# Patient Record
Sex: Male | Born: 1955
Health system: Southern US, Community
[De-identification: ages and names within clinical notes are randomized; demographics above are authoritative.]

---

## 2006-09-20 ENCOUNTER — Ambulatory Visit (HOSPITAL_COMMUNITY): Admission: AD | Admit: 2006-09-20 | Discharge: 2006-09-22 | Payer: Self-pay | Admitting: Neurosurgery

## 2013-04-03 ENCOUNTER — Other Ambulatory Visit: Payer: Self-pay | Admitting: Sports Medicine

## 2013-04-03 ENCOUNTER — Ambulatory Visit (INDEPENDENT_AMBULATORY_CARE_PROVIDER_SITE_OTHER): Payer: BC Managed Care – PPO

## 2013-04-03 DIAGNOSIS — R52 Pain, unspecified: Secondary | ICD-10-CM

## 2013-04-03 DIAGNOSIS — R937 Abnormal findings on diagnostic imaging of other parts of musculoskeletal system: Secondary | ICD-10-CM

## 2013-06-26 ENCOUNTER — Ambulatory Visit (INDEPENDENT_AMBULATORY_CARE_PROVIDER_SITE_OTHER): Payer: BC Managed Care – PPO | Admitting: Physical Therapy

## 2013-06-26 DIAGNOSIS — M25619 Stiffness of unspecified shoulder, not elsewhere classified: Secondary | ICD-10-CM

## 2013-06-26 DIAGNOSIS — M67919 Unspecified disorder of synovium and tendon, unspecified shoulder: Secondary | ICD-10-CM

## 2013-06-26 DIAGNOSIS — M719 Bursopathy, unspecified: Secondary | ICD-10-CM

## 2013-06-26 DIAGNOSIS — R609 Edema, unspecified: Secondary | ICD-10-CM

## 2013-06-26 DIAGNOSIS — M25519 Pain in unspecified shoulder: Secondary | ICD-10-CM

## 2013-06-26 DIAGNOSIS — M6281 Muscle weakness (generalized): Secondary | ICD-10-CM

## 2013-07-01 ENCOUNTER — Encounter (INDEPENDENT_AMBULATORY_CARE_PROVIDER_SITE_OTHER): Payer: BC Managed Care – PPO | Admitting: Physical Therapy

## 2013-07-01 DIAGNOSIS — M25619 Stiffness of unspecified shoulder, not elsewhere classified: Secondary | ICD-10-CM

## 2013-07-01 DIAGNOSIS — M25519 Pain in unspecified shoulder: Secondary | ICD-10-CM

## 2013-07-01 DIAGNOSIS — M67919 Unspecified disorder of synovium and tendon, unspecified shoulder: Secondary | ICD-10-CM

## 2013-07-01 DIAGNOSIS — R609 Edema, unspecified: Secondary | ICD-10-CM

## 2013-07-01 DIAGNOSIS — M719 Bursopathy, unspecified: Secondary | ICD-10-CM

## 2013-07-01 DIAGNOSIS — M6281 Muscle weakness (generalized): Secondary | ICD-10-CM

## 2013-07-03 ENCOUNTER — Encounter: Payer: BC Managed Care – PPO | Admitting: Physical Therapy

## 2013-07-03 ENCOUNTER — Encounter (INDEPENDENT_AMBULATORY_CARE_PROVIDER_SITE_OTHER): Payer: BC Managed Care – PPO

## 2013-07-03 DIAGNOSIS — M67919 Unspecified disorder of synovium and tendon, unspecified shoulder: Secondary | ICD-10-CM

## 2013-07-03 DIAGNOSIS — M25619 Stiffness of unspecified shoulder, not elsewhere classified: Secondary | ICD-10-CM

## 2013-07-03 DIAGNOSIS — R609 Edema, unspecified: Secondary | ICD-10-CM

## 2013-07-03 DIAGNOSIS — M6281 Muscle weakness (generalized): Secondary | ICD-10-CM

## 2013-07-03 DIAGNOSIS — M25519 Pain in unspecified shoulder: Secondary | ICD-10-CM

## 2013-07-03 DIAGNOSIS — M719 Bursopathy, unspecified: Secondary | ICD-10-CM

## 2013-07-08 ENCOUNTER — Encounter (INDEPENDENT_AMBULATORY_CARE_PROVIDER_SITE_OTHER): Payer: BC Managed Care – PPO | Admitting: Physical Therapy

## 2013-07-08 DIAGNOSIS — M25519 Pain in unspecified shoulder: Secondary | ICD-10-CM

## 2013-07-08 DIAGNOSIS — M25619 Stiffness of unspecified shoulder, not elsewhere classified: Secondary | ICD-10-CM

## 2013-07-08 DIAGNOSIS — R609 Edema, unspecified: Secondary | ICD-10-CM

## 2013-07-08 DIAGNOSIS — M719 Bursopathy, unspecified: Secondary | ICD-10-CM

## 2013-07-08 DIAGNOSIS — M67919 Unspecified disorder of synovium and tendon, unspecified shoulder: Secondary | ICD-10-CM

## 2013-07-08 DIAGNOSIS — M6281 Muscle weakness (generalized): Secondary | ICD-10-CM

## 2013-07-10 ENCOUNTER — Encounter: Payer: BC Managed Care – PPO | Admitting: Physical Therapy

## 2013-07-15 ENCOUNTER — Encounter: Payer: BC Managed Care – PPO | Admitting: Physical Therapy

## 2013-07-16 ENCOUNTER — Encounter (INDEPENDENT_AMBULATORY_CARE_PROVIDER_SITE_OTHER): Payer: BC Managed Care – PPO

## 2013-07-16 DIAGNOSIS — R609 Edema, unspecified: Secondary | ICD-10-CM

## 2013-07-16 DIAGNOSIS — M25519 Pain in unspecified shoulder: Secondary | ICD-10-CM

## 2013-07-16 DIAGNOSIS — M67919 Unspecified disorder of synovium and tendon, unspecified shoulder: Secondary | ICD-10-CM

## 2013-07-16 DIAGNOSIS — M25619 Stiffness of unspecified shoulder, not elsewhere classified: Secondary | ICD-10-CM

## 2013-07-16 DIAGNOSIS — M6281 Muscle weakness (generalized): Secondary | ICD-10-CM

## 2013-07-16 DIAGNOSIS — M719 Bursopathy, unspecified: Secondary | ICD-10-CM

## 2013-07-17 ENCOUNTER — Encounter (INDEPENDENT_AMBULATORY_CARE_PROVIDER_SITE_OTHER): Payer: BC Managed Care – PPO | Admitting: Physical Therapy

## 2013-07-17 DIAGNOSIS — R609 Edema, unspecified: Secondary | ICD-10-CM

## 2013-07-17 DIAGNOSIS — M25619 Stiffness of unspecified shoulder, not elsewhere classified: Secondary | ICD-10-CM

## 2013-07-17 DIAGNOSIS — M67919 Unspecified disorder of synovium and tendon, unspecified shoulder: Secondary | ICD-10-CM

## 2013-07-17 DIAGNOSIS — M25519 Pain in unspecified shoulder: Secondary | ICD-10-CM

## 2013-07-17 DIAGNOSIS — M719 Bursopathy, unspecified: Secondary | ICD-10-CM

## 2013-07-17 DIAGNOSIS — M6281 Muscle weakness (generalized): Secondary | ICD-10-CM

## 2013-07-22 ENCOUNTER — Encounter: Payer: BC Managed Care – PPO | Admitting: Physical Therapy

## 2013-07-23 ENCOUNTER — Encounter (INDEPENDENT_AMBULATORY_CARE_PROVIDER_SITE_OTHER): Payer: BC Managed Care – PPO

## 2013-07-23 DIAGNOSIS — M6281 Muscle weakness (generalized): Secondary | ICD-10-CM

## 2013-07-23 DIAGNOSIS — M25619 Stiffness of unspecified shoulder, not elsewhere classified: Secondary | ICD-10-CM

## 2013-07-23 DIAGNOSIS — M67919 Unspecified disorder of synovium and tendon, unspecified shoulder: Secondary | ICD-10-CM

## 2013-07-23 DIAGNOSIS — M719 Bursopathy, unspecified: Secondary | ICD-10-CM

## 2013-07-23 DIAGNOSIS — M25519 Pain in unspecified shoulder: Secondary | ICD-10-CM

## 2013-07-23 DIAGNOSIS — R609 Edema, unspecified: Secondary | ICD-10-CM

## 2013-07-24 ENCOUNTER — Encounter: Payer: BC Managed Care – PPO | Admitting: Physical Therapy

## 2013-07-29 ENCOUNTER — Encounter: Payer: BC Managed Care – PPO | Admitting: Physical Therapy

## 2013-08-01 ENCOUNTER — Encounter (INDEPENDENT_AMBULATORY_CARE_PROVIDER_SITE_OTHER): Payer: BC Managed Care – PPO | Admitting: Physical Therapy

## 2013-08-01 DIAGNOSIS — M719 Bursopathy, unspecified: Secondary | ICD-10-CM

## 2013-08-01 DIAGNOSIS — M67919 Unspecified disorder of synovium and tendon, unspecified shoulder: Secondary | ICD-10-CM

## 2013-08-01 DIAGNOSIS — R609 Edema, unspecified: Secondary | ICD-10-CM

## 2013-08-01 DIAGNOSIS — M6281 Muscle weakness (generalized): Secondary | ICD-10-CM

## 2013-08-01 DIAGNOSIS — M25519 Pain in unspecified shoulder: Secondary | ICD-10-CM

## 2013-08-05 ENCOUNTER — Encounter (INDEPENDENT_AMBULATORY_CARE_PROVIDER_SITE_OTHER): Payer: BC Managed Care – PPO | Admitting: Physical Therapy

## 2013-08-05 DIAGNOSIS — M25519 Pain in unspecified shoulder: Secondary | ICD-10-CM

## 2013-08-05 DIAGNOSIS — M6281 Muscle weakness (generalized): Secondary | ICD-10-CM

## 2013-08-05 DIAGNOSIS — M25619 Stiffness of unspecified shoulder, not elsewhere classified: Secondary | ICD-10-CM

## 2013-08-05 DIAGNOSIS — R609 Edema, unspecified: Secondary | ICD-10-CM

## 2013-08-05 DIAGNOSIS — M7500 Adhesive capsulitis of unspecified shoulder: Secondary | ICD-10-CM

## 2013-08-07 ENCOUNTER — Encounter (INDEPENDENT_AMBULATORY_CARE_PROVIDER_SITE_OTHER): Payer: BC Managed Care – PPO

## 2013-08-07 DIAGNOSIS — M25619 Stiffness of unspecified shoulder, not elsewhere classified: Secondary | ICD-10-CM

## 2013-08-07 DIAGNOSIS — M25519 Pain in unspecified shoulder: Secondary | ICD-10-CM

## 2013-08-07 DIAGNOSIS — M6281 Muscle weakness (generalized): Secondary | ICD-10-CM

## 2013-08-07 DIAGNOSIS — R609 Edema, unspecified: Secondary | ICD-10-CM

## 2013-08-07 DIAGNOSIS — M719 Bursopathy, unspecified: Secondary | ICD-10-CM

## 2013-08-07 DIAGNOSIS — M67919 Unspecified disorder of synovium and tendon, unspecified shoulder: Secondary | ICD-10-CM

## 2013-08-12 ENCOUNTER — Encounter (INDEPENDENT_AMBULATORY_CARE_PROVIDER_SITE_OTHER): Payer: BC Managed Care – PPO | Admitting: Physical Therapy

## 2013-08-12 DIAGNOSIS — M25519 Pain in unspecified shoulder: Secondary | ICD-10-CM

## 2013-08-12 DIAGNOSIS — M6281 Muscle weakness (generalized): Secondary | ICD-10-CM

## 2013-08-12 DIAGNOSIS — R609 Edema, unspecified: Secondary | ICD-10-CM

## 2013-08-12 DIAGNOSIS — M7500 Adhesive capsulitis of unspecified shoulder: Secondary | ICD-10-CM

## 2013-08-12 DIAGNOSIS — M25619 Stiffness of unspecified shoulder, not elsewhere classified: Secondary | ICD-10-CM

## 2013-08-14 ENCOUNTER — Encounter (INDEPENDENT_AMBULATORY_CARE_PROVIDER_SITE_OTHER): Payer: BC Managed Care – PPO | Admitting: Physical Therapy

## 2013-08-14 DIAGNOSIS — M25619 Stiffness of unspecified shoulder, not elsewhere classified: Secondary | ICD-10-CM

## 2013-08-14 DIAGNOSIS — M25519 Pain in unspecified shoulder: Secondary | ICD-10-CM

## 2013-08-14 DIAGNOSIS — M6281 Muscle weakness (generalized): Secondary | ICD-10-CM

## 2013-08-14 DIAGNOSIS — M67919 Unspecified disorder of synovium and tendon, unspecified shoulder: Secondary | ICD-10-CM

## 2013-08-14 DIAGNOSIS — M719 Bursopathy, unspecified: Secondary | ICD-10-CM

## 2013-08-14 DIAGNOSIS — R609 Edema, unspecified: Secondary | ICD-10-CM

## 2013-08-15 ENCOUNTER — Encounter: Payer: BC Managed Care – PPO | Admitting: Physical Therapy

## 2013-08-19 ENCOUNTER — Encounter (INDEPENDENT_AMBULATORY_CARE_PROVIDER_SITE_OTHER): Payer: BC Managed Care – PPO | Admitting: Physical Therapy

## 2013-08-19 DIAGNOSIS — M719 Bursopathy, unspecified: Secondary | ICD-10-CM

## 2013-08-19 DIAGNOSIS — IMO0002 Reserved for concepts with insufficient information to code with codable children: Secondary | ICD-10-CM

## 2013-08-19 DIAGNOSIS — M25519 Pain in unspecified shoulder: Secondary | ICD-10-CM

## 2013-08-19 DIAGNOSIS — M67919 Unspecified disorder of synovium and tendon, unspecified shoulder: Secondary | ICD-10-CM

## 2013-08-19 DIAGNOSIS — R609 Edema, unspecified: Secondary | ICD-10-CM

## 2013-08-21 ENCOUNTER — Encounter (INDEPENDENT_AMBULATORY_CARE_PROVIDER_SITE_OTHER): Payer: BC Managed Care – PPO

## 2013-08-21 DIAGNOSIS — M25519 Pain in unspecified shoulder: Secondary | ICD-10-CM

## 2013-08-21 DIAGNOSIS — R609 Edema, unspecified: Secondary | ICD-10-CM

## 2013-08-21 DIAGNOSIS — M719 Bursopathy, unspecified: Secondary | ICD-10-CM

## 2013-08-21 DIAGNOSIS — M6281 Muscle weakness (generalized): Secondary | ICD-10-CM

## 2013-08-21 DIAGNOSIS — M25619 Stiffness of unspecified shoulder, not elsewhere classified: Secondary | ICD-10-CM

## 2013-08-21 DIAGNOSIS — M67919 Unspecified disorder of synovium and tendon, unspecified shoulder: Secondary | ICD-10-CM

## 2013-08-26 ENCOUNTER — Encounter (INDEPENDENT_AMBULATORY_CARE_PROVIDER_SITE_OTHER): Payer: BC Managed Care – PPO | Admitting: Physical Therapy

## 2013-08-26 DIAGNOSIS — R609 Edema, unspecified: Secondary | ICD-10-CM

## 2013-08-26 DIAGNOSIS — M25519 Pain in unspecified shoulder: Secondary | ICD-10-CM

## 2013-08-26 DIAGNOSIS — M6281 Muscle weakness (generalized): Secondary | ICD-10-CM

## 2013-08-26 DIAGNOSIS — M67919 Unspecified disorder of synovium and tendon, unspecified shoulder: Secondary | ICD-10-CM

## 2013-08-26 DIAGNOSIS — M719 Bursopathy, unspecified: Secondary | ICD-10-CM

## 2013-09-02 ENCOUNTER — Encounter (INDEPENDENT_AMBULATORY_CARE_PROVIDER_SITE_OTHER): Payer: BC Managed Care – PPO | Admitting: Physical Therapy

## 2013-09-02 DIAGNOSIS — M6281 Muscle weakness (generalized): Secondary | ICD-10-CM

## 2013-09-02 DIAGNOSIS — M25619 Stiffness of unspecified shoulder, not elsewhere classified: Secondary | ICD-10-CM

## 2013-09-02 DIAGNOSIS — M25519 Pain in unspecified shoulder: Secondary | ICD-10-CM

## 2013-09-02 DIAGNOSIS — M719 Bursopathy, unspecified: Secondary | ICD-10-CM

## 2013-09-02 DIAGNOSIS — R609 Edema, unspecified: Secondary | ICD-10-CM

## 2013-09-02 DIAGNOSIS — M67919 Unspecified disorder of synovium and tendon, unspecified shoulder: Secondary | ICD-10-CM

## 2013-09-09 ENCOUNTER — Encounter (INDEPENDENT_AMBULATORY_CARE_PROVIDER_SITE_OTHER): Payer: BC Managed Care – PPO | Admitting: Physical Therapy

## 2013-09-09 DIAGNOSIS — M25619 Stiffness of unspecified shoulder, not elsewhere classified: Secondary | ICD-10-CM

## 2013-09-09 DIAGNOSIS — M67919 Unspecified disorder of synovium and tendon, unspecified shoulder: Secondary | ICD-10-CM

## 2013-09-09 DIAGNOSIS — M25519 Pain in unspecified shoulder: Secondary | ICD-10-CM

## 2013-09-09 DIAGNOSIS — M6281 Muscle weakness (generalized): Secondary | ICD-10-CM

## 2013-09-09 DIAGNOSIS — M719 Bursopathy, unspecified: Secondary | ICD-10-CM

## 2013-09-09 DIAGNOSIS — R609 Edema, unspecified: Secondary | ICD-10-CM

## 2013-09-11 ENCOUNTER — Encounter: Payer: BC Managed Care – PPO | Admitting: Physical Therapy

## 2013-09-16 ENCOUNTER — Encounter (INDEPENDENT_AMBULATORY_CARE_PROVIDER_SITE_OTHER): Payer: BC Managed Care – PPO | Admitting: Physical Therapy

## 2013-09-16 DIAGNOSIS — M719 Bursopathy, unspecified: Secondary | ICD-10-CM

## 2013-09-16 DIAGNOSIS — M25619 Stiffness of unspecified shoulder, not elsewhere classified: Secondary | ICD-10-CM

## 2013-09-16 DIAGNOSIS — M6281 Muscle weakness (generalized): Secondary | ICD-10-CM

## 2013-09-16 DIAGNOSIS — M25519 Pain in unspecified shoulder: Secondary | ICD-10-CM

## 2013-09-16 DIAGNOSIS — M67919 Unspecified disorder of synovium and tendon, unspecified shoulder: Secondary | ICD-10-CM

## 2013-09-23 ENCOUNTER — Encounter: Payer: BC Managed Care – PPO | Admitting: Physical Therapy

## 2015-09-01 DIAGNOSIS — E291 Testicular hypofunction: Secondary | ICD-10-CM | POA: Diagnosis not present

## 2015-10-06 DIAGNOSIS — E291 Testicular hypofunction: Secondary | ICD-10-CM | POA: Diagnosis not present

## 2015-11-03 DIAGNOSIS — E291 Testicular hypofunction: Secondary | ICD-10-CM | POA: Diagnosis not present

## 2015-12-30 DIAGNOSIS — Z0000 Encounter for general adult medical examination without abnormal findings: Secondary | ICD-10-CM | POA: Diagnosis not present

## 2016-02-29 DIAGNOSIS — E291 Testicular hypofunction: Secondary | ICD-10-CM | POA: Diagnosis not present

## 2016-04-07 DIAGNOSIS — E291 Testicular hypofunction: Secondary | ICD-10-CM | POA: Diagnosis not present

## 2016-04-07 DIAGNOSIS — Z23 Encounter for immunization: Secondary | ICD-10-CM | POA: Diagnosis not present

## 2016-05-16 DIAGNOSIS — E291 Testicular hypofunction: Secondary | ICD-10-CM | POA: Diagnosis not present

## 2016-06-26 DIAGNOSIS — J069 Acute upper respiratory infection, unspecified: Secondary | ICD-10-CM | POA: Diagnosis not present

## 2016-06-26 DIAGNOSIS — J9801 Acute bronchospasm: Secondary | ICD-10-CM | POA: Diagnosis not present

## 2016-07-06 DIAGNOSIS — I10 Essential (primary) hypertension: Secondary | ICD-10-CM | POA: Diagnosis not present

## 2016-07-06 DIAGNOSIS — E7800 Pure hypercholesterolemia, unspecified: Secondary | ICD-10-CM | POA: Diagnosis not present

## 2016-07-06 DIAGNOSIS — K219 Gastro-esophageal reflux disease without esophagitis: Secondary | ICD-10-CM | POA: Diagnosis not present

## 2016-07-06 DIAGNOSIS — E291 Testicular hypofunction: Secondary | ICD-10-CM | POA: Diagnosis not present

## 2016-08-11 DIAGNOSIS — I10 Essential (primary) hypertension: Secondary | ICD-10-CM | POA: Diagnosis not present

## 2016-08-11 DIAGNOSIS — K219 Gastro-esophageal reflux disease without esophagitis: Secondary | ICD-10-CM | POA: Diagnosis not present

## 2016-08-11 DIAGNOSIS — E7800 Pure hypercholesterolemia, unspecified: Secondary | ICD-10-CM | POA: Diagnosis not present

## 2016-08-11 DIAGNOSIS — J9801 Acute bronchospasm: Secondary | ICD-10-CM | POA: Diagnosis not present

## 2016-08-11 DIAGNOSIS — J069 Acute upper respiratory infection, unspecified: Secondary | ICD-10-CM | POA: Diagnosis not present

## 2016-08-11 DIAGNOSIS — E349 Endocrine disorder, unspecified: Secondary | ICD-10-CM | POA: Diagnosis not present

## 2016-09-11 DIAGNOSIS — E291 Testicular hypofunction: Secondary | ICD-10-CM | POA: Diagnosis not present

## 2016-09-22 DIAGNOSIS — M109 Gout, unspecified: Secondary | ICD-10-CM | POA: Diagnosis not present

## 2016-10-13 DIAGNOSIS — E291 Testicular hypofunction: Secondary | ICD-10-CM | POA: Diagnosis not present

## 2016-12-05 DIAGNOSIS — E291 Testicular hypofunction: Secondary | ICD-10-CM | POA: Diagnosis not present

## 2017-01-11 DIAGNOSIS — E291 Testicular hypofunction: Secondary | ICD-10-CM | POA: Diagnosis not present

## 2017-01-16 DIAGNOSIS — Z0000 Encounter for general adult medical examination without abnormal findings: Secondary | ICD-10-CM | POA: Diagnosis not present

## 2017-01-16 DIAGNOSIS — K219 Gastro-esophageal reflux disease without esophagitis: Secondary | ICD-10-CM | POA: Diagnosis not present

## 2017-01-16 DIAGNOSIS — I10 Essential (primary) hypertension: Secondary | ICD-10-CM | POA: Diagnosis not present

## 2017-01-16 DIAGNOSIS — E7800 Pure hypercholesterolemia, unspecified: Secondary | ICD-10-CM | POA: Diagnosis not present

## 2017-01-16 DIAGNOSIS — J309 Allergic rhinitis, unspecified: Secondary | ICD-10-CM | POA: Diagnosis not present

## 2017-03-07 DIAGNOSIS — Z23 Encounter for immunization: Secondary | ICD-10-CM | POA: Diagnosis not present

## 2017-03-07 DIAGNOSIS — E291 Testicular hypofunction: Secondary | ICD-10-CM | POA: Diagnosis not present

## 2017-05-30 DIAGNOSIS — E291 Testicular hypofunction: Secondary | ICD-10-CM | POA: Diagnosis not present

## 2017-07-11 DIAGNOSIS — E7800 Pure hypercholesterolemia, unspecified: Secondary | ICD-10-CM | POA: Diagnosis not present

## 2017-07-11 DIAGNOSIS — E291 Testicular hypofunction: Secondary | ICD-10-CM | POA: Diagnosis not present

## 2017-07-11 DIAGNOSIS — I10 Essential (primary) hypertension: Secondary | ICD-10-CM | POA: Diagnosis not present

## 2017-07-11 DIAGNOSIS — K219 Gastro-esophageal reflux disease without esophagitis: Secondary | ICD-10-CM | POA: Diagnosis not present

## 2017-07-19 DIAGNOSIS — E291 Testicular hypofunction: Secondary | ICD-10-CM | POA: Diagnosis not present

## 2017-08-14 ENCOUNTER — Other Ambulatory Visit: Payer: Self-pay | Admitting: Unknown Physician Specialty

## 2017-08-14 DIAGNOSIS — J029 Acute pharyngitis, unspecified: Secondary | ICD-10-CM | POA: Diagnosis not present

## 2017-08-14 DIAGNOSIS — R05 Cough: Secondary | ICD-10-CM | POA: Diagnosis not present

## 2017-08-14 DIAGNOSIS — R220 Localized swelling, mass and lump, head: Secondary | ICD-10-CM | POA: Diagnosis not present

## 2017-08-14 DIAGNOSIS — K1379 Other lesions of oral mucosa: Secondary | ICD-10-CM

## 2017-08-14 DIAGNOSIS — R6884 Jaw pain: Secondary | ICD-10-CM | POA: Diagnosis not present

## 2017-08-15 ENCOUNTER — Ambulatory Visit (INDEPENDENT_AMBULATORY_CARE_PROVIDER_SITE_OTHER): Payer: BLUE CROSS/BLUE SHIELD

## 2017-08-15 DIAGNOSIS — R590 Localized enlarged lymph nodes: Secondary | ICD-10-CM

## 2017-08-15 DIAGNOSIS — K118 Other diseases of salivary glands: Secondary | ICD-10-CM | POA: Diagnosis not present

## 2017-08-15 DIAGNOSIS — K1379 Other lesions of oral mucosa: Secondary | ICD-10-CM

## 2017-10-18 DIAGNOSIS — E291 Testicular hypofunction: Secondary | ICD-10-CM | POA: Diagnosis not present

## 2017-10-23 DIAGNOSIS — J309 Allergic rhinitis, unspecified: Secondary | ICD-10-CM | POA: Diagnosis not present

## 2017-10-23 DIAGNOSIS — J9801 Acute bronchospasm: Secondary | ICD-10-CM | POA: Diagnosis not present

## 2017-12-27 DIAGNOSIS — E291 Testicular hypofunction: Secondary | ICD-10-CM | POA: Diagnosis not present

## 2018-01-31 DIAGNOSIS — Z23 Encounter for immunization: Secondary | ICD-10-CM | POA: Diagnosis not present

## 2018-01-31 DIAGNOSIS — I10 Essential (primary) hypertension: Secondary | ICD-10-CM | POA: Diagnosis not present

## 2018-01-31 DIAGNOSIS — M109 Gout, unspecified: Secondary | ICD-10-CM | POA: Diagnosis not present

## 2018-01-31 DIAGNOSIS — E7800 Pure hypercholesterolemia, unspecified: Secondary | ICD-10-CM | POA: Diagnosis not present

## 2018-01-31 DIAGNOSIS — E291 Testicular hypofunction: Secondary | ICD-10-CM | POA: Diagnosis not present

## 2018-01-31 DIAGNOSIS — Z0000 Encounter for general adult medical examination without abnormal findings: Secondary | ICD-10-CM | POA: Diagnosis not present

## 2018-02-01 DIAGNOSIS — Z79899 Other long term (current) drug therapy: Secondary | ICD-10-CM | POA: Diagnosis not present

## 2018-02-01 DIAGNOSIS — R7989 Other specified abnormal findings of blood chemistry: Secondary | ICD-10-CM | POA: Diagnosis not present

## 2018-03-04 DIAGNOSIS — E291 Testicular hypofunction: Secondary | ICD-10-CM | POA: Diagnosis not present

## 2018-04-04 DIAGNOSIS — E291 Testicular hypofunction: Secondary | ICD-10-CM | POA: Diagnosis not present

## 2018-05-09 DIAGNOSIS — E291 Testicular hypofunction: Secondary | ICD-10-CM | POA: Diagnosis not present

## 2018-05-09 DIAGNOSIS — R51 Headache: Secondary | ICD-10-CM | POA: Diagnosis not present

## 2018-05-09 DIAGNOSIS — J329 Chronic sinusitis, unspecified: Secondary | ICD-10-CM | POA: Diagnosis not present

## 2018-06-04 DIAGNOSIS — E291 Testicular hypofunction: Secondary | ICD-10-CM | POA: Diagnosis not present

## 2018-06-07 IMAGING — US US SOFT TISSUE HEAD/NECK
1 series · 9 of 9 positions shown · non-contrast
Comparison: None.

ADDENDUM:
Please add the following to the clinical information
CLINICAL DATA: The palpable abnormality is in the soft tissues just
anterior to the left ear lobe and at the level of the angle of the
mandible. The patient did point out the palpable abnormality. The
examination was done over the palpable abnormality.
CLINICAL DATA: Left submandibular nodule with tenderness.

EXAM:
ULTRASOUND OF HEAD/NECK SOFT TISSUES
TECHNIQUE: Ultrasound examination of the head and neck soft tissues was
performed in the area of clinical concern.

[Series 1: us soft tissue head/neck · 0.06mm/px · 9 of 9 slices shown]
[im 1/9]
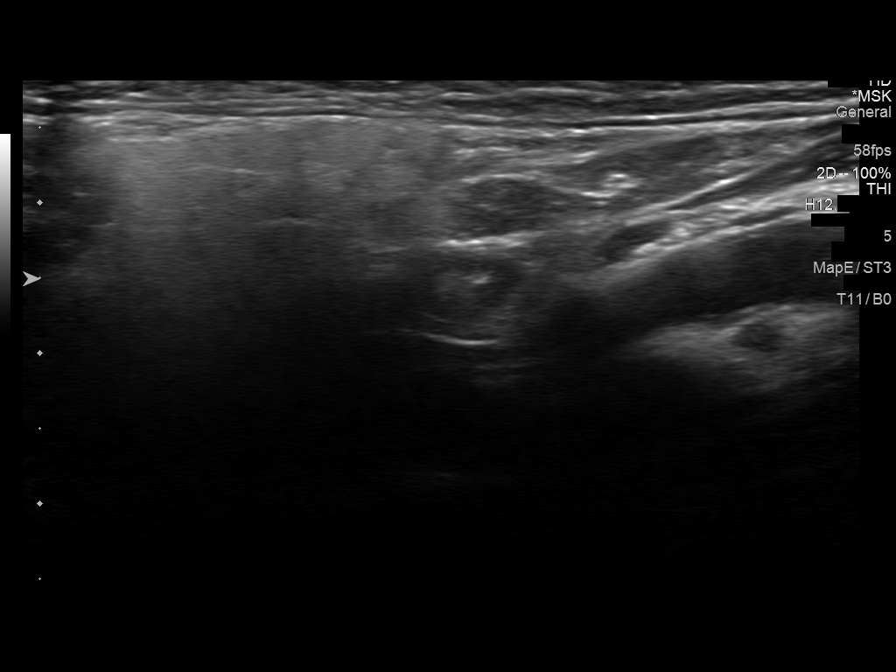
[im 2/9]
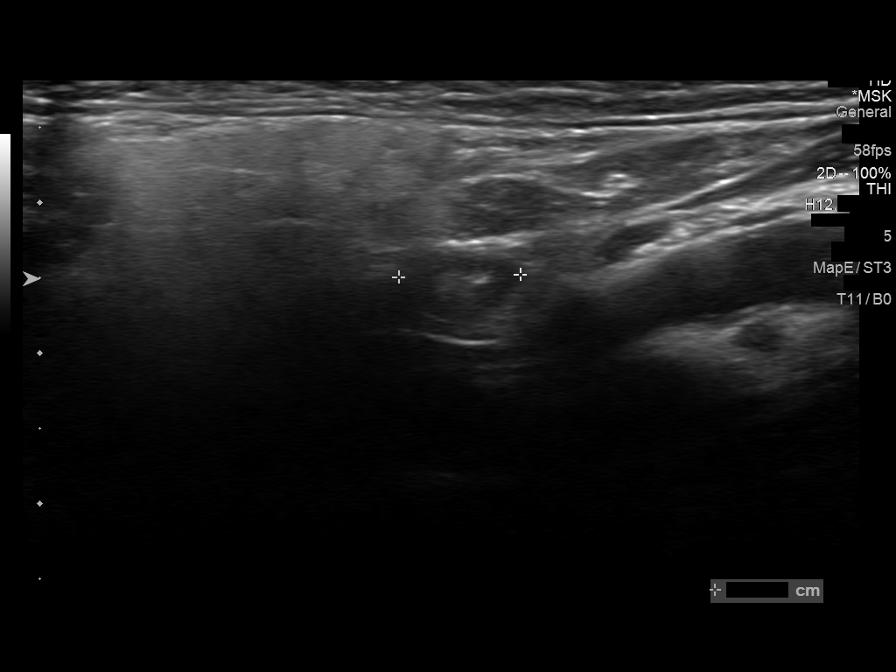
[im 3/9]
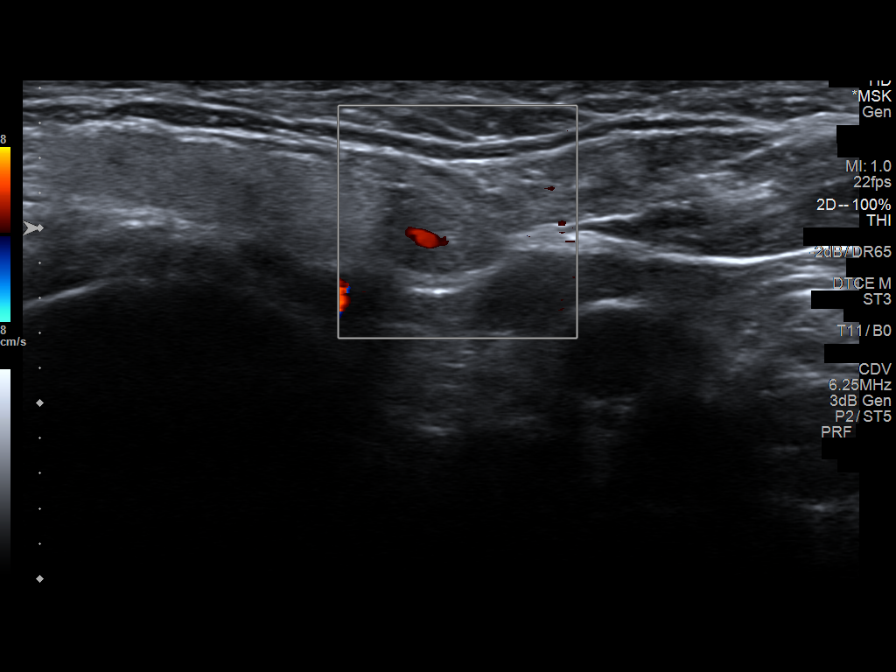
[im 4/9]
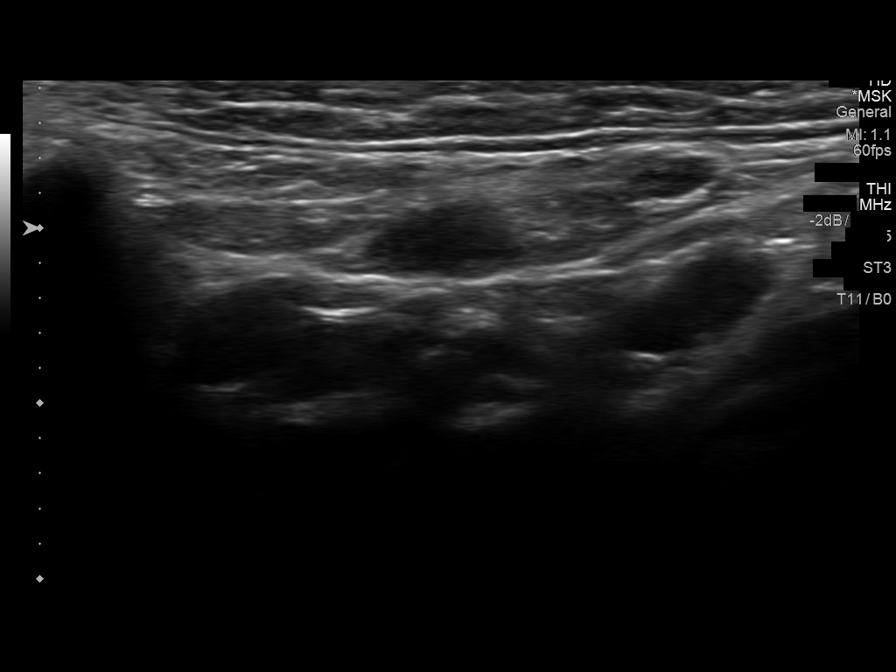
[im 5/9]
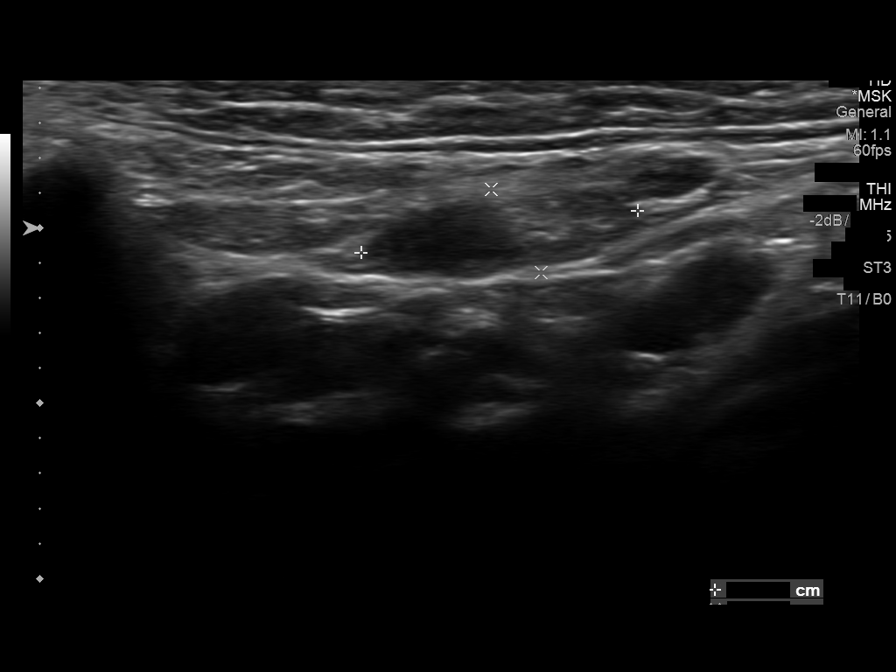
[im 6/9]
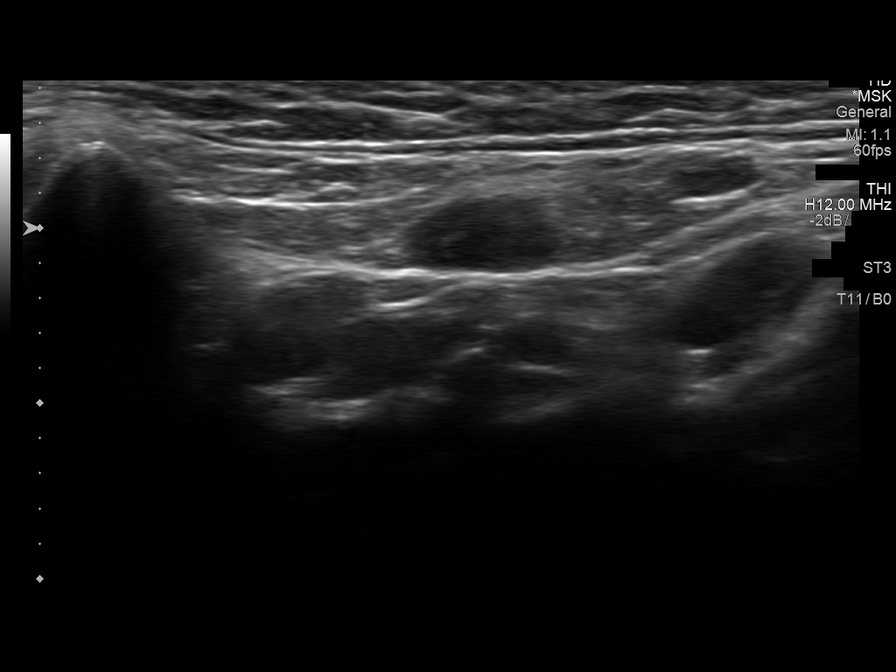
[im 7/9]
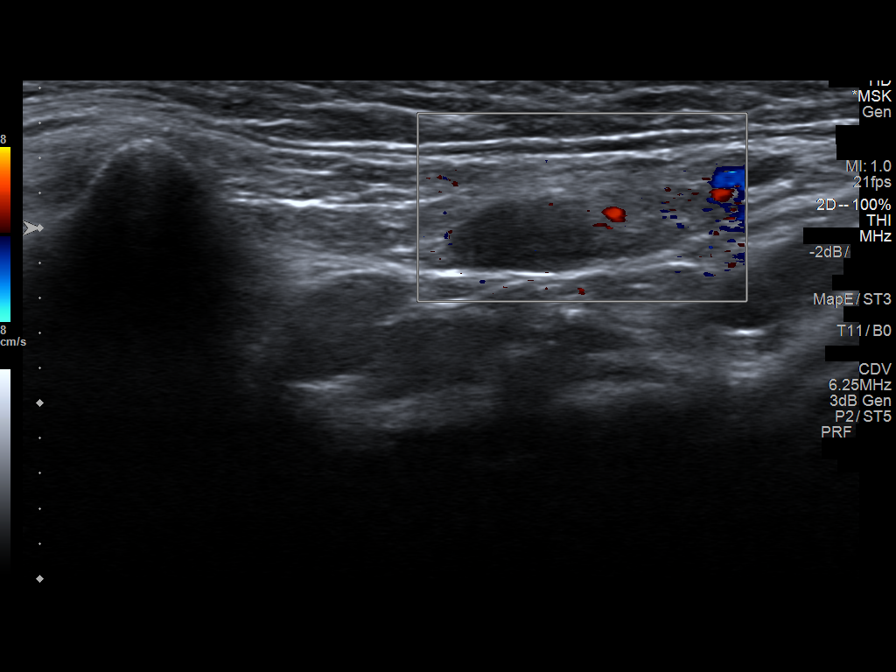
[im 8/9]
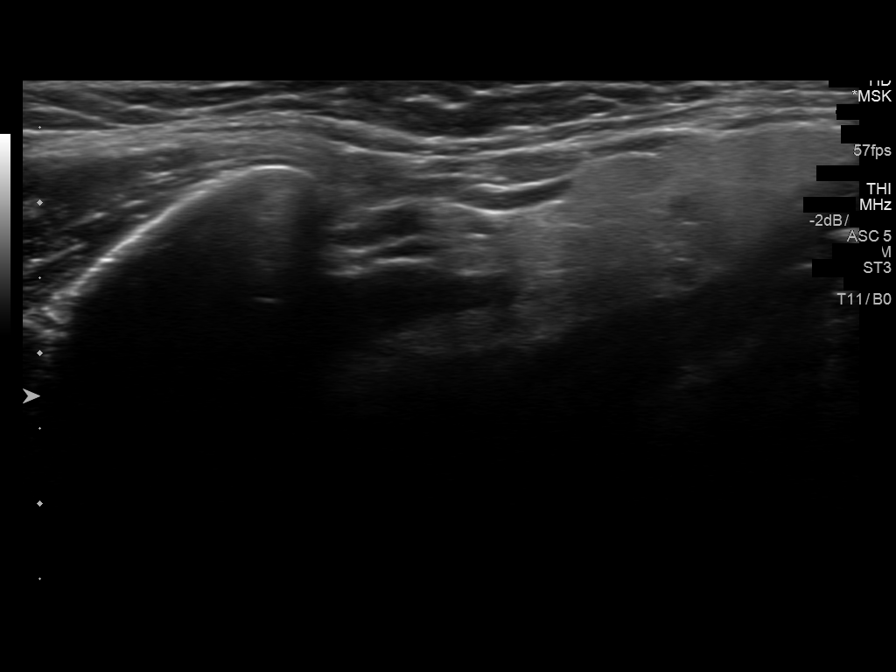
[im 9/9]
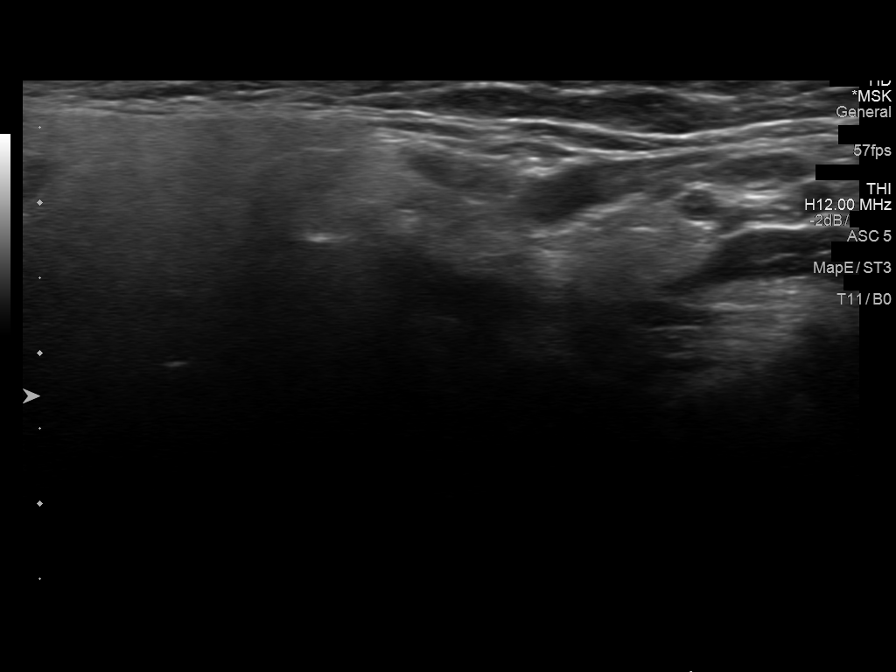

[9 of 9 positions shown; findings below may reference images not displayed]

FINDINGS: The palpable abnormality in the left submandibular region
corresponds to a 0.6 cm short axis diameter lymph node.
IMPRESSION: The palpable abnormality corresponds to a 0.6 cm short axis diameter
lymph node.

## 2018-08-01 DIAGNOSIS — E7800 Pure hypercholesterolemia, unspecified: Secondary | ICD-10-CM | POA: Diagnosis not present

## 2018-08-01 DIAGNOSIS — I10 Essential (primary) hypertension: Secondary | ICD-10-CM | POA: Diagnosis not present

## 2018-11-13 DIAGNOSIS — M545 Low back pain: Secondary | ICD-10-CM | POA: Diagnosis not present

## 2018-11-20 ENCOUNTER — Encounter: Payer: Self-pay | Admitting: Physical Therapy

## 2018-11-20 ENCOUNTER — Ambulatory Visit: Payer: BLUE CROSS/BLUE SHIELD | Admitting: Physical Therapy

## 2018-11-20 ENCOUNTER — Other Ambulatory Visit: Payer: Self-pay

## 2018-11-20 DIAGNOSIS — R293 Abnormal posture: Secondary | ICD-10-CM

## 2018-11-20 DIAGNOSIS — M545 Low back pain, unspecified: Secondary | ICD-10-CM

## 2018-11-20 DIAGNOSIS — G8929 Other chronic pain: Secondary | ICD-10-CM | POA: Diagnosis not present

## 2018-11-20 DIAGNOSIS — M6281 Muscle weakness (generalized): Secondary | ICD-10-CM

## 2018-11-20 NOTE — Patient Instructions (Signed)
Access Code: [email or web address]  URL: [email or web address]  Date: 11/20/2018  Prepared by: Faustino Congress   Exercises  Supine Hamstring Stretch with Strap - 3 reps - 1 sets - 30 sec hold - 2x daily - 7x weekly  Seated Hamstring Stretch - 3 reps - 1 sets - 30 sec hold - 2x daily - 7x weekly  Prone on Elbows Stretch - 1 reps - 1 sets - 3 min hold - 2x daily - 7x weekly  Seated Hip Flexor Stretch - 3 reps - 1 sets - 30 sec hold - 2x daily - 7x weekly  Seated Figure 4 Piriformis Stretch - 3 reps - 1 sets - 30 sec hold - 2x daily - 7x weekly

## 2018-11-20 NOTE — Therapy (Signed)
[facility] [address] [place], [place], [address] Phone: [phone]   Fax:  [phone]  Physical Therapy Evaluation  Patient Details  Name: Johnny Arnold MRN: [id number] Date of Birth: 1956/04/23 Referring Provider (PT): Gavin PoundPhelps, Cathy P, [place]   Encounter Date: 11/20/2018  PT End of Session - 11/20/18 1220    Visit Number  1    Number of Visits  12    Date for PT Re-Evaluation  01/01/19    PT Start Time  1135    PT Stop Time  1215    PT Time Calculation (min)  40 min    Activity Tolerance  Patient tolerated treatment well    Behavior During Therapy  [facility] for tasks assessed/performed       History reviewed. No pertinent past medical history.  History reviewed. No pertinent surgical history.  There were no vitals filed for this visit.   Subjective Assessment - 11/20/18 1137    Subjective  Pt is a 63 y/o male who presents to OPPT for Lt sided LBP x 1 year.  Pt reports he is active with work and plays golf which can be painful at times.    Limitations  Standing;Sitting;Walking    How long can you stand comfortably?  1 hour    How long can you walk comfortably?  "several hundred yards"    Diagnostic tests  xrays: arthritis    Patient Stated Goals  improve pain, work and play golf    Currently in Pain?  Yes    Pain Score  4    up to 8/10; at best 2/10   Pain Location  Back    Pain Orientation  Left;Lower    Pain Descriptors / Indicators  Tingling;Sharp    Pain Type  Chronic pain    Pain Radiating Towards  tingling to Lt ankle occasionally    Pain Onset  More than a month ago    Pain Frequency  Constant    Aggravating Factors   standing, lifting, walking, forward bending    Pain Relieving Factors  ice, aleve, prednisone    Effect of Pain on Daily Activities  pain subsides quickly         [facility] - 11/20/18 1141      Assessment   Medical Diagnosis  lumbar spondylosis    Referring Provider  (PT)  Gavin PoundPhelps, Cathy P, PA-C    Onset Date/Surgical Date  --   chronic, 1 year   Next MD Visit  3 weeks    Prior Therapy  at this clinic several years ago      Precautions   Precautions  None      Restrictions   Weight Bearing Restrictions  No      Balance Screen   Has the patient fallen in the past 6 months  No    Has the patient had a decrease in activity level because of a fear of falling?   No    Is the patient reluctant to leave their home because of a fear of falling?   No      Home Environment   Living Environment  Private residence    Living Arrangements  Spouse/significant other    Type of Home  House    Home Access  Stairs to enter    Entrance Stairs-Number of Steps  10   from the basement   Entrance Stairs-Rails  Right    Home Layout  One level  Additional Comments  pain with stairs; has to perform step-to occasionally      Prior Function   Level of Independence  Independent    Vocation  Full time employment    Technical brewer - climibing ladders, digging ditches, physical work; lifting up to [employer], fishing, elliptical and light weights, hamstring stretches daily      Cognition   Overall Cognitive Status  Within Functional Limits for tasks assessed      Observation/Other Assessments   Focus on Therapeutic Outcomes (FOTO)   45 (55% limited; predicted 40% limited)      Posture/Postural Control   Posture/Postural Control  Postural limitations    Postural Limitations  Rounded Shoulders;Forward head;Decreased lumbar lordosis      ROM / Strength   AROM / PROM / Strength  AROM;Strength      AROM   AROM Assessment Site  Lumbar    Lumbar Flexion  limited 25% with pain    Lumbar - Left Side Bend  limited 10% with increased pain      Strength   Strength Assessment Site  Hip;Knee;Ankle    Right/Left Hip  Right;Left    Right Hip Flexion  5/5    Left Hip Flexion  4/5    Left Hip Extension  4/5    Left Hip ABduction  5/5     Right/Left Knee  Right;Left    Right Knee Extension  5/5    Left Knee Flexion  4-/5   with pain   Right/Left Ankle  Right;Left    Right Ankle Dorsiflexion  5/5    Left Ankle Dorsiflexion  5/5      Flexibility   Soft Tissue Assessment /Muscle Length  yes   tight hip flexor bil   Hamstrings  90/90 test knee flexion: Lt 39 degrees; Rt 35 degrees    Piriformis  tightness bil      Palpation   Spinal mobility  hypomobile spine T10-L5    SI assessment   appears WNL in pron      Special Tests    Special Tests  Lumbar    Lumbar Tests  Slump Test;Straight Leg Raise      Slump test   Findings  Negative      Straight Leg Raise   Findings  Negative      Ambulation/Gait   Gait Pattern  Decreased stance time - left;Decreased step length - right;Left circumduction;Left hip hike                Objective measurements completed on examination: See above findings.      [facility] Adult PT Treatment/Exercise - 11/20/18 1141      Exercises   Exercises  Lumbar      Lumbar Exercises: Stretches   Passive Hamstring Stretch  Left;1 rep;30 seconds    Passive Hamstring Stretch Limitations  supine and seated    Hip Flexor Stretch  Left;1 rep;30 seconds    Hip Flexor Stretch Limitations  seated    Prone on Elbows Stretch  3 reps;60 seconds   continuous   Piriformis Stretch  Left;1 rep;30 seconds    Piriformis Stretch Limitations  seated             PT Education - 11/20/18 1220    Education Details  HEP    Person(s) Educated  Patient    Methods  Explanation;Demonstration;Handout    Comprehension  Verbalized understanding;Returned demonstration;Need further instruction  PT Long Term Goals - 11/20/18 1223      PT LONG TERM GOAL #1   Title  independent with HEP    Status  New    Target Date  01/01/19      PT LONG TERM GOAL #2   Title  improve FOTO score to </= 40% limitation    Status  New    Target Date  01/01/19      PT LONG TERM GOAL #3   Title   demonstrate improved hamstring length with 90/90 test at least 25 degrees knee flexion or better    Status  New    Target Date  01/01/19      PT LONG TERM GOAL #4   Title  report pain < 5/10 with work activities (climbing ladders, lifting, carrying) for improved function    Status  New    Target Date  01/01/19             Plan - 11/20/18 1220    Clinical Impression Statement  Pt is a 63 y/o male who presents to OPPT for Lt sided LBP.  Pt demonstrates decreased flexibility and strength, as well as decreased ROM and gait abnormalities affecting functional mobility.  Pt will benefit from PT to maximize function.    Personal Factors and Comorbidities  Comorbidity 2    Comorbidities  OA, HTN    Examination-Activity Limitations  Sit;Stairs;Carry;Stand;Locomotion Level;Bend    Examination-Participation Restrictions  Other   work   Conservation officer, historic buildingstability/Clinical Decision Making  Evolving/Moderate complexity    Clinical Decision Making  Moderate    Rehab Potential  Good    PT Frequency  2x / week    PT Duration  6 weeks    PT Treatment/Interventions  ADLs/Self Care Home Management;Cryotherapy;Electrical Stimulation;Moist Heat;Therapeutic exercise;Therapeutic activities;Functional mobility training;Stair training;Gait training;Ultrasound;Neuromuscular re-education;Patient/family education;Manual techniques;Taping;Dry needling    PT Next Visit Plan  review HEP, progress as able, core/hip strengthening, modalities PRN    PT Home Exercise Plan  Access Code: [email or web address]    Consulted and Agree with Plan of Care  Patient       Patient will benefit from skilled therapeutic intervention in order to improve the following deficits and impairments:  Abnormal gait, Hypomobility, Pain, Decreased range of motion, Impaired flexibility, Postural dysfunction, Decreased strength  Visit Diagnosis: 1. Chronic left-sided low back pain without sciatica   2. Muscle weakness (generalized)   3. Abnormal posture         Problem List There are no active problems to display for this patient.     Clarita CraneStephanie F Talita Recht, PT, DPT 11/20/18 12:27 PM     [facility] [address] [place], [place], [address] Phone: [phone]   Fax:  [phone]  Name: Johnny Arnold MRN: [id number] Date of Birth: 1955/06/04

## 2018-11-27 ENCOUNTER — Ambulatory Visit: Payer: BLUE CROSS/BLUE SHIELD | Admitting: Physical Therapy

## 2018-11-27 ENCOUNTER — Encounter: Payer: Self-pay | Admitting: Physical Therapy

## 2018-11-27 ENCOUNTER — Other Ambulatory Visit: Payer: Self-pay

## 2018-11-27 DIAGNOSIS — M6281 Muscle weakness (generalized): Secondary | ICD-10-CM | POA: Diagnosis not present

## 2018-11-27 DIAGNOSIS — M545 Low back pain, unspecified: Secondary | ICD-10-CM

## 2018-11-27 DIAGNOSIS — R293 Abnormal posture: Secondary | ICD-10-CM | POA: Diagnosis not present

## 2018-11-27 DIAGNOSIS — G8929 Other chronic pain: Secondary | ICD-10-CM

## 2018-11-27 NOTE — Therapy (Signed)
[place], [place], [address] Phone: [phone]   Fax:  [phone]  Physical Therapy Treatment  Patient Details  Name: Johnny Arnold MRN: [id number] Date of Birth: 1956/02/11 Referring Provider (PT): Randolm Idol, [place]   Encounter Date: 11/27/2018  PT End of Session - 11/27/18 1006    Visit Number  2    Number of Visits  12    Date for PT Re-Evaluation  01/01/19    PT Start Time  1006    PT Stop Time  1100    PT Time Calculation (min)  54 min       History reviewed. No pertinent past medical history.  History reviewed. No pertinent surgical history.  There were no vitals filed for this visit.  Subjective Assessment - 11/27/18 1010    Subjective  Pt reports the Prednisone helped a lot. He thinks the stretches have helped.  He has difficulty unloading dishwasher - "I'd rather handwash the dishes than bend over"    Currently in Pain?  Yes    Pain Score  2    took Aleve at 8:30   Pain Location  Back    Pain Orientation  Left    Pain Descriptors / Indicators  Dull;Aching    Aggravating Factors   bending forward.    Pain Relieving Factors  medicine         [facility] PT Assessment - 11/27/18 0001      Assessment   Medical Diagnosis  lumbar spondylosis    Referring Provider (PT)  Randolm Idol, PA-C    Onset Date/Surgical Date  --   chronic, 1 year   Next MD Visit  3 weeks    Prior Therapy  at this clinic several years ago       [facility] Adult PT Treatment/Exercise - 11/27/18 0001      Self-Care   Self-Care  Other Self-Care Comments    Other Self-Care Comments   pt educated in self massage with ball to lumbar and hip musculature; pt returned demo with cues.       Lumbar Exercises: Stretches   Passive Hamstring Stretch  Right;Left;2 reps   supine opp knee bent -Trial in sitting - painful in back, stopped.    Lower Trunk Rotation  3 reps;10 seconds   arms in T, limited range   Lower Trunk Rotation Limitations  not tolerated well; stopped.     Hip Flexor Stretch  Right;Left;2 reps;30 seconds   seated   Prone on Elbows Stretch  4 reps;20 seconds    Piriformis Stretch  Right;Left;2 reps;30 seconds   supine    Piriformis Stretch Limitations  limited ability in seated position      Lumbar Exercises: Aerobic   Nustep  L4-5: 7 min    PTA present to discuss progress     Lumbar Exercises: Supine   Other Supine Lumbar Exercises  pt instructed in log roll sit to/from supine; returned demo 1x.       Modalities   Modalities  Teacher, English as a foreign language Location  Lt SI joint and surrounding tissue.     Electrical Stimulation Action  IFC    Electrical Stimulation Parameters   to tolerance x 10 min     Electrical Stimulation Goals  Pain       Ice pack to lumbar spine, 10 min during estim, for pain.   Discussed exercises to avoid  at home until LBP has resolved. Pt agreeable.        PT Education - 11/27/18 1054    Education Details  posture and body mechanics; modified HEP    Person(s) Educated  Patient    Methods  Explanation;Demonstration;Verbal cues;Handout    Comprehension  Verbalized understanding;Returned demonstration          PT Long Term Goals - 11/20/18 1223      PT LONG TERM GOAL #1   Title  independent with HEP    Status  New    Target Date  01/01/19      PT LONG TERM GOAL #2   Title  improve FOTO score to </= 40% limitation    Status  New    Target Date  01/01/19      PT LONG TERM GOAL #3   Title  demonstrate improved hamstring length with 90/90 test at least 25 degrees knee flexion or better    Status  New    Target Date  01/01/19      PT LONG TERM GOAL #4   Title  report pain < 5/10 with work activities (climbing ladders, lifting, carrying) for improved function    Status  New    Target Date  01/01/19            Plan - 11/27/18 1052    Clinical Impression Statement  Pt  required minor cues to adjust the stretches so as not to increase pain.  HEP modified to avoid increased LBP with hip stretches.  Pt progressing towards goals.    Personal Factors and Comorbidities  Comorbidity 2    Comorbidities  OA, HTN    Examination-Activity Limitations  Sit;Stairs;Carry;Stand;Locomotion Level;Bend    Examination-Participation Restrictions  Other   work   Conservation officer, historic buildingstability/Clinical Decision Making  Evolving/Moderate complexity    Rehab Potential  Good    PT Frequency  2x / week    PT Duration  6 weeks    PT Treatment/Interventions  ADLs/Self Care Home Management;Cryotherapy;Electrical Stimulation;Moist Heat;Therapeutic exercise;Therapeutic activities;Functional mobility training;Stair training;Gait training;Ultrasound;Neuromuscular re-education;Patient/family education;Manual techniques;Taping;Dry needling    PT Next Visit Plan  progress HEP as able, core/hip strengthening, modalities PRN. initiate core strengthening for functional activities.    PT Home Exercise Plan  Access Code: [email or web address]    Consulted and Agree with Plan of Care  Patient       Patient will benefit from skilled therapeutic intervention in order to improve the following deficits and impairments:  Abnormal gait, Hypomobility, Pain, Decreased range of motion, Impaired flexibility, Postural dysfunction, Decreased strength  Visit Diagnosis: 1. Chronic left-sided low back pain without sciatica   2. Muscle weakness (generalized)   3. Abnormal posture        Problem List There are no active problems to display for this patient.  Mayer CamelJennifer Carlson-Long, PTA 11/27/18 11:10 AM  [facility] [address] [place], [place], [address] Phone: [phone]   Fax:  [phone]  Name: Johnny GulaRichard Arnold MRN: [id number] Date of Birth: 1955/05/31

## 2018-11-27 NOTE — Patient Instructions (Signed)

## 2018-12-02 ENCOUNTER — Encounter: Payer: BLUE CROSS/BLUE SHIELD | Admitting: Physical Therapy

## 2018-12-04 ENCOUNTER — Other Ambulatory Visit: Payer: Self-pay

## 2018-12-04 ENCOUNTER — Ambulatory Visit: Payer: BLUE CROSS/BLUE SHIELD | Admitting: Physical Therapy

## 2018-12-04 ENCOUNTER — Encounter: Payer: Self-pay | Admitting: Physical Therapy

## 2018-12-04 DIAGNOSIS — G8929 Other chronic pain: Secondary | ICD-10-CM | POA: Diagnosis not present

## 2018-12-04 DIAGNOSIS — R293 Abnormal posture: Secondary | ICD-10-CM

## 2018-12-04 DIAGNOSIS — M545 Low back pain, unspecified: Secondary | ICD-10-CM

## 2018-12-04 DIAGNOSIS — M6281 Muscle weakness (generalized): Secondary | ICD-10-CM

## 2018-12-04 NOTE — Therapy (Signed)
[facility] [address] [place], [place], [address] Phone: [phone]   Fax:  [phone]  Physical Therapy Treatment  Patient Details  Name: Johnny Arnold MRN: [id number] Date of Birth: August 09, 1955 Referring Provider (PT): Gavin PoundPhelps, Cathy P, [place]   Encounter Date: 12/04/2018  PT End of Session - 12/04/18 1015    Visit Number  3    Number of Visits  12    Date for PT Re-Evaluation  01/01/19    PT Start Time  0933    PT Stop Time  1013    PT Time Calculation (min)  40 min    Activity Tolerance  Patient tolerated treatment well    Behavior During Therapy  [facility] for tasks assessed/performed       History reviewed. No pertinent past medical history.  History reviewed. No pertinent surgical history.  There were no vitals filed for this visit.  Subjective Assessment - 12/04/18 0935    Subjective  has been in the garden and to the driving range this morning.  back was a little sore after: ice, TENS, aleve and hot shower which all helped a lot.    Diagnostic tests  xrays: arthritis    Patient Stated Goals  improve pain, work and play golf    Currently in Pain?  Yes    Pain Score  2     Pain Location  Back    Pain Orientation  Left    Pain Descriptors / Indicators  Dull;Aching    Pain Type  Chronic pain    Pain Onset  More than a month ago    Pain Frequency  Constant    Aggravating Factors   bending forward    Pain Relieving Factors  medicine                       [facility] Adult PT Treatment/Exercise - 12/04/18 0938      Lumbar Exercises: Stretches   Passive Hamstring Stretch  Right;Left;3 reps;30 seconds   supine opp knee bent   Piriformis Stretch  Right;Left;3 reps;30 seconds      Lumbar Exercises: Aerobic   Nustep  L5 x 7 min      Lumbar Exercises: Supine   Ab Set  10 reps;5 seconds    Bridge  10 reps;5 seconds      Lumbar Exercises: Sidelying   Hip Abduction  Both;10 reps    Hip Abduction  Limitations  min cues for technique      Manual Therapy   Manual Therapy  Soft tissue mobilization    Manual therapy comments  skilled palpation and monitoring of soft tissue during DN    Soft tissue mobilization  Lt glute med       Trigger Point Dry Needling - 12/04/18 0001    Consent Given?  Yes    Education Handout Provided  Yes    Muscles Treated Back/Hip  Gluteus medius    Gluteus Medius Response  Twitch response elicited;Palpable increased muscle length           PT Education - 12/04/18 1015    Education Details  DN    Person(s) Educated  Patient    Methods  Explanation   added to HEP, will provide handout next session   Comprehension  Verbalized understanding          PT Long Term Goals - 11/20/18 1223      PT LONG TERM GOAL #1   Title  independent with HEP    Status  New    Target Date  01/01/19      PT LONG TERM GOAL #2   Title  improve FOTO score to </= 40% limitation    Status  New    Target Date  01/01/19      PT LONG TERM GOAL #3   Title  demonstrate improved hamstring length with 90/90 test at least 25 degrees knee flexion or better    Status  New    Target Date  01/01/19      PT LONG TERM GOAL #4   Title  report pain < 5/10 with work activities (climbing ladders, lifting, carrying) for improved function    Status  New    Target Date  01/01/19            Plan - 12/04/18 1016    Clinical Impression Statement  Pt report pain has improved overall, and has increased activity overall, increasing pain threshold.  Dry needling and manual therapy performed today to help with muscle tightness with positive response.  Will continue to benefit from PT to maximize function.    Personal Factors and Comorbidities  Comorbidity 2    Comorbidities  OA, HTN    Examination-Activity Limitations  Sit;Stairs;Carry;Stand;Locomotion Level;Bend    Examination-Participation Restrictions  Other   work   Merchant navy officer  Evolving/Moderate  complexity    Rehab Potential  Good    PT Frequency  2x / week    PT Duration  6 weeks    PT Treatment/Interventions  ADLs/Self Care Home Management;Cryotherapy;Electrical Stimulation;Moist Heat;Therapeutic exercise;Therapeutic activities;Functional mobility training;Stair training;Gait training;Ultrasound;Neuromuscular re-education;Patient/family education;Manual techniques;Taping;Dry needling    PT Next Visit Plan  progress HEP as able, core/hip strengthening, modalities PRN. initiate core strengthening for functional activities.    PT Home Exercise Plan  Access Code: [email or web address]    Consulted and Agree with Plan of Care  Patient       Patient will benefit from skilled therapeutic intervention in order to improve the following deficits and impairments:  Abnormal gait, Hypomobility, Pain, Decreased range of motion, Impaired flexibility, Postural dysfunction, Decreased strength  Visit Diagnosis: 1. Chronic left-sided low back pain without sciatica   2. Muscle weakness (generalized)   3. Abnormal posture        Problem List There are no active problems to display for this patient.     Laureen Abrahams, PT, DPT 12/04/18 10:17 AM     [facility] [place], [place], [address] Phone: [phone]   Fax:  [phone]  Name: Johnny Arnold MRN: [id number] Date of Birth: [birth year]

## 2018-12-04 NOTE — Patient Instructions (Signed)
Access Code: [email or web address]  URL: [email or web address]  Date: 12/04/2018  Prepared by: Faustino Congress   Exercises  Prone on Elbows Stretch - 3 reps - 1 sets - 20 seconds hold - 2x daily - 7x weekly  Seated Hip Flexor Stretch - 3 reps - 1 sets - 30 sec hold - 2x daily - 7x weekly  Supine Piriformis Stretch Pulling Heel to Hip - 3 reps - 1 sets - 30 seconds hold - 2x daily - 7x weekly  Hooklying Hamstring Stretch with Strap - 2-3 reps - 1 sets - 30 seconds hold - 2x daily - 7x weekly  Patient Education  Trigger Point Dry Needling

## 2018-12-05 DIAGNOSIS — E291 Testicular hypofunction: Secondary | ICD-10-CM | POA: Diagnosis not present

## 2018-12-11 ENCOUNTER — Other Ambulatory Visit: Payer: Self-pay

## 2018-12-11 ENCOUNTER — Ambulatory Visit: Payer: BLUE CROSS/BLUE SHIELD | Admitting: Physical Therapy

## 2018-12-11 DIAGNOSIS — R293 Abnormal posture: Secondary | ICD-10-CM

## 2018-12-11 DIAGNOSIS — M6281 Muscle weakness (generalized): Secondary | ICD-10-CM

## 2018-12-11 DIAGNOSIS — G8929 Other chronic pain: Secondary | ICD-10-CM | POA: Diagnosis not present

## 2018-12-11 DIAGNOSIS — M545 Low back pain, unspecified: Secondary | ICD-10-CM

## 2018-12-11 NOTE — Therapy (Signed)
[place] [address] [place], [place], [address] Phone: [phone]   Fax:  [phone]  Physical Therapy Treatment  Patient Details  Name: Johnny Arnold MRN: [id number] Date of Birth: 09-18-1955 Referring Provider (PT): Randolm Idol, [place]   Encounter Date: 12/11/2018  PT End of Session - 12/11/18 2703    Visit Number  4    Number of Visits  12    Date for PT Re-Evaluation  01/01/19    PT Start Time  0904    PT Stop Time  0948    PT Time Calculation (min)  44 min       No past medical history on file.  No past surgical history on file.  There were no vitals filed for this visit.  Subjective Assessment - 12/11/18 0908    Subjective  Pt reports his pain has been elevated since yesterday afternoon after golf.  He has taken aleve prior to arrival.  He states he's unsure if the DN helped. He reports sometimes his low back pops in the place where it hurts.    Currently in Pain?  Yes    Pain Score  6     Pain Location  Back    Pain Orientation  Left    Pain Descriptors / Indicators  Sharp    Aggravating Factors   crossing Lt leg at knee, bending over, tying shoe    Pain Relieving Factors  medicine         [facility] PT Assessment - 12/11/18 0001      Assessment   Medical Diagnosis  lumbar spondylosis    Referring Provider (PT)  Randolm Idol, PA-C    Onset Date/Surgical Date  --   chronic, 1 year   Next MD Visit  12/25/18    Prior Therapy  at this clinic several years ago        [facility] Adult PT Treatment/Exercise - 12/11/18 0001      Lumbar Exercises: Stretches   Passive Hamstring Stretch  Right;Left;2 reps;30 seconds    Piriformis Stretch  Right;2 reps;Left;1 rep;30 seconds    Piriformis Stretch Limitations  Lt hip not tolerated well.       Lumbar Exercises: Aerobic   Elliptical  L1: 3 min       Lumbar Exercises: Sidelying   Clam  Left;Right;10 reps    Hip Abduction  Left;Right;10 reps    Hip  Abduction Limitations  min cues for technique      Modalities   Modalities  --   pt declined; will use at home     Manual Therapy   Manual Therapy  Soft tissue mobilization    Manual therapy comments  skilled palpation and monitoring of soft tissue during DN     STM to Lt glutes  Trigger Point Dry Needling - 12/11/18 0001    Consent Given?  Yes    Muscles Treated Back/Hip  Gluteus medius    Gluteus Medius Response  Twitch response elicited;Palpable increased muscle length      DN performed with estim today, 37mHz freq to tolerance x 5 min.          PT Long Term Goals - 11/20/18 1223      PT LONG TERM GOAL #1   Title  independent with HEP    Status  New    Target Date  01/01/19      PT LONG TERM GOAL #2   Title  improve FOTO score  to </= 40% limitation    Status  New    Target Date  01/01/19      PT LONG TERM GOAL #3   Title  demonstrate improved hamstring length with 90/90 test at least 25 degrees knee flexion or better    Status  New    Target Date  01/01/19      PT LONG TERM GOAL #4   Title  report pain < 5/10 with work activities (climbing ladders, lifting, carrying) for improved function    Status  New    Target Date  01/01/19            Plan - 12/11/18 0951    Clinical Impression Statement  Pt continues wiht tightness in his Lt hip; limited tolerance for hip stretches as it increases his pain. supervising PT, Moshe CiproStephanie Matthews performed dry needling and manual therapy with good twitch response.  Pt remains motivated to progress towards therapy goals.    Personal Factors and Comorbidities  Comorbidity 2    Comorbidities  OA, HTN    Examination-Activity Limitations  Sit;Stairs;Carry;Stand;Locomotion Level;Bend    Examination-Participation Restrictions  Other   work   Conservation officer, historic buildingstability/Clinical Decision Making  Evolving/Moderate complexity    Rehab Potential  Good    PT Frequency  2x / week    PT Duration  6 weeks    PT Treatment/Interventions  ADLs/Self  Care Home Management;Cryotherapy;Electrical Stimulation;Moist Heat;Therapeutic exercise;Therapeutic activities;Functional mobility training;Stair training;Gait training;Ultrasound;Neuromuscular re-education;Patient/family education;Manual techniques;Taping;Dry needling    PT Next Visit Plan  progress HEP as able, core/hip strengthening, modalities PRN. assess response to DN.  initiate core strengthening for functional activities.    PT Home Exercise Plan  Access Code: [email or web address]    Consulted and Agree with Plan of Care  Patient       Patient will benefit from skilled therapeutic intervention in order to improve the following deficits and impairments:  Abnormal gait, Hypomobility, Pain, Decreased range of motion, Impaired flexibility, Postural dysfunction, Decreased strength  Visit Diagnosis: 1. Chronic left-sided low back pain without sciatica   2. Muscle weakness (generalized)   3. Abnormal posture        Problem List There are no active problems to display for this patient.  Mayer CamelJennifer Carlson-Long, PTA 12/11/18 12:22 PM   Clarita CraneStephanie F Matthews, PT, DPT 12/12/18 9:06 AM   [facility] [address] [place], [place], [address] Phone: [phone]   Fax:  [phone]  Name: Joanette GulaRichard Grandison MRN: [id number] Date of Birth: 09-Dec-1955

## 2018-12-18 ENCOUNTER — Other Ambulatory Visit: Payer: Self-pay

## 2018-12-18 ENCOUNTER — Ambulatory Visit (INDEPENDENT_AMBULATORY_CARE_PROVIDER_SITE_OTHER): Payer: BLUE CROSS/BLUE SHIELD | Admitting: Physical Therapy

## 2018-12-18 DIAGNOSIS — M545 Low back pain, unspecified: Secondary | ICD-10-CM

## 2018-12-18 DIAGNOSIS — M6281 Muscle weakness (generalized): Secondary | ICD-10-CM | POA: Diagnosis not present

## 2018-12-18 DIAGNOSIS — G8929 Other chronic pain: Secondary | ICD-10-CM

## 2018-12-18 DIAGNOSIS — R293 Abnormal posture: Secondary | ICD-10-CM | POA: Diagnosis not present

## 2018-12-18 NOTE — Therapy (Signed)
[place], [place], [address] Phone: [phone]   Fax:  [phone]  Physical Therapy Treatment  Patient Details  Name: Johnny Arnold MRN: [id number] Date of Birth: 1956-03-02 Referring Provider (PT): Randolm Idol, [place]   Encounter Date: 12/18/2018  PT End of Session - 12/18/18 0907    Visit Number  5    Number of Visits  12    Date for PT Re-Evaluation  01/01/19    PT Start Time  0902    PT Stop Time  0945    PT Time Calculation (min)  43 min       No past medical history on file.  No past surgical history on file.  There were no vitals filed for this visit.  Subjective Assessment - 12/18/18 0911    Subjective  Pt is unsure if the DN last session helped.  "It sure smarts the next day". He states his mid-back has been in a spasm for a few days.  He wore his back brace playing golf and that helped some.  He is considering shot from MD next visits.    Patient Stated Goals  improve pain, work and play golf    Currently in Pain?  Yes    Pain Score  5     Pain Location  Back    Pain Orientation  Left    Pain Descriptors / Indicators  Burning;Sharp    Aggravating Factors   cross Lt leg at knee, tying shoe, bending over    Pain Relieving Factors  medicine.         [facility] PT Assessment - 12/18/18 0001      Assessment   Medical Diagnosis  lumbar spondylosis    Referring Provider (PT)  Randolm Idol, PA-C    Onset Date/Surgical Date  --   chronic, 1 year   Next MD Visit  12/25/18    Prior Therapy  at this clinic several years ago      Observation/Other Assessments   Focus on Therapeutic Outcomes (FOTO)   68 (50% limitation)        [facility] Adult PT Treatment/Exercise - 12/18/18 0001      Lumbar Exercises: Stretches   Passive Hamstring Stretch  Right;Left;3 reps;30 seconds    Hip Flexor Stretch  Right;Left;2 reps;30 seconds    Hip Flexor Stretch Limitations  standing, leg extended     Lumbar Stabilization Level 1 Limitations  Rt x 2 reps with arm overhead     Prone on Elbows Stretch  3 reps;20 seconds    Quad Stretch  Left;3 reps;Right;2 reps;30 seconds   prone with strap     Lumbar Exercises: Aerobic   Elliptical  L1: 4 min       Lumbar Exercises: Supine   Ab Set  5 reps;5 seconds    Bent Knee Raise  5 reps   TA engaged, limited tolerance on LLE   Bridge  10 reps;5 seconds      Modalities   Modalities  Iontophoresis;Ultrasound      Ultrasound   Ultrasound Location  Lt SI joint and surrounding tissue      Ultrasound Parameters  100%, 1.2 w/cm2, 8 min     Ultrasound Goals  Pain      Iontophoresis   Type of Iontophoresis  Dexamethasone    Location  Lt SI joint    Dose  1.0 cc    Time  120 mA patch, 8-12 hrs  Manual Therapy   Soft tissue mobilization  Lt glute med, Lt low back, Lt/ Rt iliacus              PT Education - 12/18/18 1307    Education Details  ionto info    Person(s) Educated  Patient    Methods  Explanation;Handout    Comprehension  Verbalized understanding          PT Long Term Goals - 11/20/18 1223      PT LONG TERM GOAL #1   Title  independent with HEP    Status  New    Target Date  01/01/19      PT LONG TERM GOAL #2   Title  improve FOTO score to </= 40% limitation    Status  New    Target Date  01/01/19      PT LONG TERM GOAL #3   Title  demonstrate improved hamstring length with 90/90 test at least 25 degrees knee flexion or better    Status  New    Target Date  01/01/19      PT LONG TERM GOAL #4   Title  report pain < 5/10 with work activities (climbing ladders, lifting, carrying) for improved function    Status  New    Target Date  01/01/19            Plan - 12/18/18 [id number]    Clinical Impression Statement Persistent pain in Lt posterior hip/LB, of 5/10, affecting his ability to complete work tasks. Pt reports increased pain in Lt SI joint with supine hip flexion, regardless of TA engagement.  Some mild increase in pain in LBP with bridge exercise. Hamstrings and quads remain tight. Encouraged pt to avoid standard sit ups as part of his exercise routine at home.  Trial of ionto to Rt SI joint.  Pt making gradual gains towards goals.    Rehab Potential  Good    PT Frequency  2x / week    PT Duration  6 weeks    PT Next Visit Plan  progress HEP as able, including core/hip strengthening, modalities PRN.    PT Home Exercise Plan  Access Code: [email or web address]    Consulted and Agree with Plan of Care  Patient       Patient will benefit from skilled therapeutic intervention in order to improve the following deficits and impairments:  Abnormal gait, Hypomobility, Pain, Decreased range of motion, Impaired flexibility, Postural dysfunction, Decreased strength  Visit Diagnosis: 1. Chronic left-sided low back pain without sciatica   2. Muscle weakness (generalized)   3. Abnormal posture        Problem List There are no active problems to display for this patient.  Mayer CamelJennifer Carlson-Long, PTA 12/18/18 1:07 PM  [facility] [address] [place], [place], [address] Phone: [phone]   Fax:  [phone]  Name: Johnny Arnold MRN: [id number] Date of Birth: [birth year]

## 2018-12-18 NOTE — Patient Instructions (Signed)

## 2018-12-25 ENCOUNTER — Encounter: Payer: Self-pay | Admitting: Physical Therapy

## 2018-12-25 ENCOUNTER — Other Ambulatory Visit: Payer: Self-pay

## 2018-12-25 ENCOUNTER — Ambulatory Visit: Payer: BLUE CROSS/BLUE SHIELD | Admitting: Physical Therapy

## 2018-12-25 DIAGNOSIS — R293 Abnormal posture: Secondary | ICD-10-CM

## 2018-12-25 DIAGNOSIS — M545 Low back pain, unspecified: Secondary | ICD-10-CM

## 2018-12-25 DIAGNOSIS — M6281 Muscle weakness (generalized): Secondary | ICD-10-CM | POA: Diagnosis not present

## 2018-12-25 DIAGNOSIS — G8929 Other chronic pain: Secondary | ICD-10-CM

## 2018-12-25 NOTE — Therapy (Signed)
[facility] [address] [place], [place], [address] Phone: [phone]   Fax:  [phone]  Physical Therapy Treatment/Re-Cert  Patient Details  Name: Johnny GulaRichard Arnold MRN: [id number] Date of Birth: Aug 14, 1955 Referring Provider (PT): Gavin PoundPhelps, Cathy P, [place]   Encounter Date: 12/25/2018  PT End of Session - 12/25/18 1115    Visit Number  6    Number of Visits  12    Date for PT Re-Evaluation  02/05/19    PT Start Time  1030    PT Stop Time  1110    PT Time Calculation (min)  40 min    Activity Tolerance  Patient tolerated treatment well    Behavior During Therapy  [facility] for tasks assessed/performed       History reviewed. No pertinent past medical history.  History reviewed. No pertinent surgical history.  There were no vitals filed for this visit.  Subjective Assessment - 12/25/18 1030    Subjective  doing well; returns after recent MD visit.  [facility]  doctor wants to continue PT for 6 more weeks.    Patient Stated Goals  improve pain, work and play golf    Currently in Pain?  Yes    Pain Score  3     Pain Location  Back    Pain Orientation  Left    Pain Descriptors / Indicators  Sharp    Pain Onset  More than a month ago    Pain Frequency  Constant    Aggravating Factors   weight bearing through [facility]                       [facility] Adult PT Treatment/Exercise - 12/25/18 1033      Lumbar Exercises: Stretches   Passive Hamstring Stretch  Right;Left;3 reps;30 seconds    Passive Hamstring Stretch Limitations  seated and supine    Quadruped Mid Back Stretch Limitations  seated ball roll to mid/Lt/Rt 3x15 sec each    Quad Stretch  Right;Left;3 reps;30 seconds   prone with strap; 1/2 foam roll for hip flexor stretch     Lumbar Exercises: Aerobic   Nustep  L5 x 6 min      Lumbar Exercises: Supine   Other Supine Lumbar Exercises  isometric hip abduction 10x5 sec      Lumbar  Exercises: Prone   Straight Leg Raise  10 reps;3 seconds   alternating                 PT Long Term Goals - 12/25/18 1116      PT LONG TERM GOAL #1   Title  independent with HEP    Status  On-going    Target Date  02/05/19      PT LONG TERM GOAL #2   Title  improve FOTO score to </= 40% limitation    Status  On-going    Target Date  02/05/19      PT LONG TERM GOAL #3   Title  demonstrate improved hamstring length with 90/90 test at least 25 degrees knee flexion or better    Status  On-going    Target Date  02/05/19      PT LONG TERM GOAL #4   Title  report pain < 5/10 with work activities (climbing ladders, lifting, carrying) for improved function    Status  On-going    Target Date  02/05/19  Plan - 12/25/18 1116    Clinical Impression Statement  Pt tolerated session well today, arrived with new order for PT x 6 more weeks.  Goals extended 6 more week.  Continues to have variable pain and decreased flexibility.  Slowly progressing towards goals.    Rehab Potential  Good    PT Frequency  2x / week    PT Duration  6 weeks    PT Next Visit Plan  progress HEP as able, including core/hip strengthening, modalities PRN.    PT Home Exercise Plan  Access Code: [email or web address]    Consulted and Agree with Plan of Care  Patient       Patient will benefit from skilled therapeutic intervention in order to improve the following deficits and impairments:  Abnormal gait, Hypomobility, Pain, Decreased range of motion, Impaired flexibility, Postural dysfunction, Decreased strength  Visit Diagnosis: 1. Chronic left-sided low back pain without sciatica   2. Muscle weakness (generalized)   3. Abnormal posture        Problem List There are no active problems to display for this patient.     Laureen Abrahams, PT, DPT 12/25/18 11:18 AM     [facility] [place], [place],  [address] Phone: [phone]   Fax:  [phone]  Name: Johnny Arnold MRN: [id number] Date of Birth: Oct 22, 1955

## 2019-01-01 ENCOUNTER — Other Ambulatory Visit: Payer: Self-pay

## 2019-01-01 ENCOUNTER — Ambulatory Visit (INDEPENDENT_AMBULATORY_CARE_PROVIDER_SITE_OTHER): Payer: BLUE CROSS/BLUE SHIELD | Admitting: Physical Therapy

## 2019-01-01 ENCOUNTER — Encounter: Payer: Self-pay | Admitting: Physical Therapy

## 2019-01-01 DIAGNOSIS — R293 Abnormal posture: Secondary | ICD-10-CM | POA: Diagnosis not present

## 2019-01-01 DIAGNOSIS — G8929 Other chronic pain: Secondary | ICD-10-CM | POA: Diagnosis not present

## 2019-01-01 DIAGNOSIS — M545 Low back pain, unspecified: Secondary | ICD-10-CM

## 2019-01-01 DIAGNOSIS — M6281 Muscle weakness (generalized): Secondary | ICD-10-CM

## 2019-01-01 NOTE — Therapy (Signed)
[facility] [address] [place], [place], [address] Phone: [phone]   Fax:  [phone]  Physical Therapy Treatment  Patient Details  Name: Johnny Arnold MRN: [id number] Date of Birth: 06/04/1955 Referring Provider (PT): Gavin PoundPhelps, Cathy P, [place]   Encounter Date: 01/01/2019  PT End of Session - 01/01/19 [id number]    Visit Number  7    Number of Visits  12    Date for PT Re-Evaluation  02/05/19    PT Start Time  0820   pt arrived late   PT Stop Time  0908    PT Time Calculation (min)  48 min    Activity Tolerance  Patient tolerated treatment well    Behavior During Therapy  [facility] for tasks assessed/performed       History reviewed. No pertinent past medical history.  History reviewed. No pertinent surgical history.  There were no vitals filed for this visit.  Subjective Assessment - 01/01/19 0825    Currently in Pain?  Yes    Pain Score  2    6/10 in AM, took Aleve prior to arrival   Pain Location  Sacrum   SI joint   Pain Orientation  Left    Pain Descriptors / Indicators  Aching    Aggravating Factors   sitting too long, in certain positions    Pain Relieving Factors  medicine, walking around         [facility] PT Assessment - 01/01/19 0001      AROM   AROM Assessment Site  Hip    Right/Left Hip  Right;Left    Right Hip External Rotation   35    Right Hip Internal Rotation   24    Left Hip External Rotation   27    Left Hip Internal Rotation   17      Flexibility   Hamstrings  Rt: 62, Lt 64 deg; 90/90 test: RLE 42 deg, LLE 40 deg       [facility] Adult PT Treatment/Exercise - 01/01/19 0001      Lumbar Exercises: Stretches   Passive Hamstring Stretch  Right;Left;3 reps;30 seconds   supine with strap   Quad Stretch  Right;Left;2 reps;30 seconds   PTA assist for stretch; verbally told about strap for HEP   Piriformis Stretch  Right;Left;2 reps;20 seconds    Figure 4 Stretch  1 rep;30 seconds;Supine;With  overpressure      Lumbar Exercises: Aerobic   Elliptical  L1: 5 min       Lumbar Exercises: Supine   Clam  10 reps   with green TB   Bridge  10 reps    Bridge with Harley-DavidsonBall Squeeze  10 reps      Manual Therapy   Manual Therapy  Soft tissue mobilization    Manual therapy comments  pt prone     Soft tissue mobilization  Lt glute and hip rotators with/without passive ER/IR of LE;  TPR to Lt glute med and piriformis.   IASTM to bilat hamstrings to decrease fascial restrictions              PT Education - 01/01/19 0929    Education Details  HEP    Person(s) Educated  Patient    Methods  Explanation;Handout;Verbal cues    Comprehension  Verbalized understanding          PT Long Term Goals - 12/25/18 1116      PT LONG TERM GOAL #1   Title  independent with HEP    Status  On-going    Target Date  02/05/19      PT LONG TERM GOAL #2   Title  improve FOTO score to </= 40% limitation    Status  On-going    Target Date  02/05/19      PT LONG TERM GOAL #3   Title  demonstrate improved hamstring length with 90/90 test at least 25 degrees knee flexion or better    Status  On-going    Target Date  02/05/19      PT LONG TERM GOAL #4   Title  report pain < 5/10 with work activities (climbing ladders, lifting, carrying) for improved function    Status  On-going    Target Date  02/05/19            Plan - 01/01/19 0923    Clinical Impression Statement  Slight improvement in hamstring flexibility.  Pt initially had increased discomfort with bridge and resisted clam exercise, but lessened with increased reps. Quads are tight; added stretch to HEP.  Pt's hip rotators of Lt hip much tighter than Rt.  May benefit from DN / manual therapy to this area.  Slowly progressing towards goals.    Rehab Potential  Good    PT Frequency  2x / week    PT Duration  6 weeks    PT Next Visit Plan  manual therapy/ DN to Lt hip rotators, Rt ant hip.  assess response to new exercises for HEP .     PT Home Exercise Plan  Access Code: [email or web address]    Consulted and Agree with Plan of Care  Patient       Patient will benefit from skilled therapeutic intervention in order to improve the following deficits and impairments:  Abnormal gait, Hypomobility, Pain, Decreased range of motion, Impaired flexibility, Postural dysfunction, Decreased strength  Visit Diagnosis: 1. Chronic left-sided low back pain without sciatica   2. Muscle weakness (generalized)   3. Abnormal posture        Problem List There are no active problems to display for this patient.  Kerin Perna, PTA 01/01/19 9:30 AM  [facility] [place], [place], [address] Phone: [phone]   Fax:  [phone]  Name: Johnny Arnold MRN: [id number] Date of Birth: Feb 09, 1956

## 2019-01-01 NOTE — Patient Instructions (Signed)
Access Code: [email or web address]  URL: [email or web address]  Date: 01/01/2019  Prepared by: Kerin Perna   Exercises  Added: Prone Quadriceps Stretch with Strap - 2-3 reps - 1 sets - 30 sec hold - 2x daily - 7x weekly  Hooklying Clamshell with Resistance - 10 reps - 1-2 sets - 1x daily - 3-4x weekly  Supine Bridge with Mini Swiss Ball Between Knees - 10 reps - 1-2 sets - 1x daily - 3-4x weekly

## 2019-01-08 ENCOUNTER — Ambulatory Visit: Payer: BLUE CROSS/BLUE SHIELD | Admitting: Physical Therapy

## 2019-01-08 ENCOUNTER — Other Ambulatory Visit: Payer: Self-pay

## 2019-01-08 ENCOUNTER — Encounter: Payer: Self-pay | Admitting: Physical Therapy

## 2019-01-08 DIAGNOSIS — M545 Low back pain, unspecified: Secondary | ICD-10-CM

## 2019-01-08 DIAGNOSIS — G8929 Other chronic pain: Secondary | ICD-10-CM | POA: Diagnosis not present

## 2019-01-08 DIAGNOSIS — R293 Abnormal posture: Secondary | ICD-10-CM

## 2019-01-08 DIAGNOSIS — M6281 Muscle weakness (generalized): Secondary | ICD-10-CM | POA: Diagnosis not present

## 2019-01-08 NOTE — Therapy (Signed)
[place] [place], [place], [address] Phone: [phone]   Fax:  [phone]  Physical Therapy Treatment  Patient Details  Name: Johnny Arnold MRN: [id number] Date of Birth: May 28, 1956 Referring Provider (PT): Randolm Idol, [place]   Encounter Date: 01/08/2019  PT End of Session - 01/08/19 0842    Visit Number  8    Number of Visits  12    Date for PT Re-Evaluation  02/05/19    PT Start Time  0800    PT Stop Time  0838    PT Time Calculation (min)  38 min    Activity Tolerance  Patient tolerated treatment well    Behavior During Therapy  [facility] for tasks assessed/performed       History reviewed. No pertinent past medical history.  History reviewed. No pertinent surgical history.  There were no vitals filed for this visit.  Subjective Assessment - 01/08/19 0803    Subjective  pain seems to be improved; "not so bad this morning, but yesterday it was a pain."    How long can you stand comfortably?  "as long as i need to"    How long can you walk comfortably?  "as far as I need to"    Patient Stated Goals  improve pain, work and play golf    Currently in Pain?  Yes    Pain Score  1     Pain Location  Sacrum    Pain Orientation  Left    Pain Descriptors / Indicators  Aching    Pain Type  Chronic pain    Pain Onset  More than a month ago    Pain Frequency  Constant    Aggravating Factors   sitting too long, in certain positions    Pain Relieving Factors  medicine, walking around                       [facility] Adult PT Treatment/Exercise - 01/08/19 0806      Lumbar Exercises: Stretches   Passive Hamstring Stretch  Right;Left;3 reps;30 seconds   seated and supine   Figure 4 Stretch  3 reps;30 seconds;Seated;With overpressure;1 rep;20 seconds;Supine   improved flexibility after DN     Lumbar Exercises: Aerobic   Nustep  L5 x 6 min      Manual Therapy   Manual Therapy  Soft tissue  mobilization    Soft tissue mobilization  IASTM to Lt glutes and rotators       Trigger Point Dry Needling - 01/08/19 0841    Consent Given?  Yes    Muscles Treated Back/Hip  Obturator internus;Piriformis    Piriformis Response  Twitch response elicited;Palpable increased muscle length    Obturator internus Response  Twitch response elicited;Palpable increased muscle length                PT Long Term Goals - 12/25/18 1116      PT LONG TERM GOAL #1   Title  independent with HEP    Status  On-going    Target Date  02/05/19      PT LONG TERM GOAL #2   Title  improve FOTO score to </= 40% limitation    Status  On-going    Target Date  02/05/19      PT LONG TERM GOAL #3   Title  demonstrate improved hamstring length with 90/90 test at least 25 degrees knee flexion or better  Status  On-going    Target Date  02/05/19      PT LONG TERM GOAL #4   Title  report pain < 5/10 with work activities (climbing ladders, lifting, carrying) for improved function    Status  On-going    Target Date  02/05/19            Plan - 01/08/19 0842    Clinical Impression Statement  Pt overall reporting decreased pain and increased function, and progressing well with PT.  DN and manual therapy performed to Lt piriformis and rotators today with positive response to flexibility today.  Will continue to benefit from PT to maximize function.    Rehab Potential  Good    PT Frequency  2x / week    PT Duration  6 weeks    PT Next Visit Plan  assess response to manual therapy/ DN to Lt hip rotators, Rt ant hip.    PT Home Exercise Plan  Access Code: [email or web address]    Consulted and Agree with Plan of Care  Patient       Patient will benefit from skilled therapeutic intervention in order to improve the following deficits and impairments:  Abnormal gait, Hypomobility, Pain, Decreased range of motion, Impaired flexibility, Postural dysfunction, Decreased strength  Visit Diagnosis: 1. Chronic  left-sided low back pain without sciatica   2. Muscle weakness (generalized)   3. Abnormal posture        Problem List There are no active problems to display for this patient.     Clarita CraneStephanie F Jahaziel Francois, PT, DPT 01/08/19 8:44 AM    [facility] [address] [place], [place], [address] Phone: [phone]   Fax:  [phone]  Name: Johnny Arnold MRN: [id number] Date of Birth: Jan 13, 1956

## 2019-01-09 DIAGNOSIS — M5416 Radiculopathy, lumbar region: Secondary | ICD-10-CM | POA: Diagnosis not present

## 2019-01-15 ENCOUNTER — Encounter: Payer: Self-pay | Admitting: Physical Therapy

## 2019-01-15 ENCOUNTER — Other Ambulatory Visit: Payer: Self-pay

## 2019-01-15 ENCOUNTER — Ambulatory Visit: Payer: BLUE CROSS/BLUE SHIELD | Admitting: Physical Therapy

## 2019-01-15 DIAGNOSIS — M6281 Muscle weakness (generalized): Secondary | ICD-10-CM

## 2019-01-15 DIAGNOSIS — M545 Low back pain, unspecified: Secondary | ICD-10-CM

## 2019-01-15 DIAGNOSIS — R293 Abnormal posture: Secondary | ICD-10-CM | POA: Diagnosis not present

## 2019-01-15 DIAGNOSIS — G8929 Other chronic pain: Secondary | ICD-10-CM

## 2019-01-15 NOTE — Therapy (Signed)
[place], [place], [address] Phone: [phone]   Fax:  [phone]  Physical Therapy Treatment  Patient Details  Name: Johnny Arnold MRN: [id number] Date of Birth: [birth year] Referring Provider (PT): Randolm Idol, [place]   Encounter Date: 01/15/2019  PT End of Session - 01/15/19 0808    Visit Number  9    Number of Visits  12    Date for PT Re-Evaluation  02/05/19    PT Start Time  0803    PT Stop Time  9678    PT Time Calculation (min)  41 min       History reviewed. No pertinent past medical history.  History reviewed. No pertinent surgical history.  There were no vitals filed for this visit.  Subjective Assessment - 01/15/19 0806    Subjective  Pt reports he has been up all night, not able to sleep. There was death in the family.  He could hardly get out of bed this morning. He states he gets an injection in his back on Friday.    Currently in Pain?  Yes    Pain Score  6     Pain Location  Sacrum    Pain Orientation  Left    Pain Descriptors / Indicators  Aching    Aggravating Factors   being still too long    Pain Relieving Factors  medicine, walking around         [facility] PT Assessment - 01/15/19 0001      Assessment   Medical Diagnosis  lumbar spondylosis    Referring Provider (PT)  Randolm Idol, PA-C    Onset Date/Surgical Date  --   chronic, 1 year   Prior Therapy  at this clinic several years ago       [facility] Adult PT Treatment/Exercise - 01/15/19 0001      Lumbar Exercises: Stretches   Active Hamstring Stretch  Right;Left    Active Hamstring Stretch Limitations  dynamic leg swings with UE support on chair x 20 sec each leg     Other Lumbar Stretch Exercise  dynamic leg swings with UE support on chair x 20 sec each leg (hip abdct/ add)       Lumbar Exercises: Aerobic   Elliptical  L1: 1 min, limited tolerance - switched to treadmill.     Tread Mill  1.8 mph x 5  min     Other Aerobic Exercise  single laps around gym (80 ft) in between manual therapy       Lumbar Exercises: Sidelying   Clam  Left;5 reps   and reverse clam x 5 reps     Electrical Stimulation   Electrical Stimulation Location  Lt SI joint and surrounding tissue.     Electrical Stimulation Action  IFC    Electrical Stimulation Parameters  to tolerance     Electrical Stimulation Goals  Pain      Iontophoresis   Type of Iontophoresis  Dexamethasone    Location  Lt SI joint    Dose  1.0 cc    Time  120 mA patch, 8-12 hrs      Manual Therapy   Manual Therapy  Joint mobilization;Soft tissue mobilization;Passive ROM    Manual therapy comments  pt sidelying and prone     Joint Mobilization  Lt hip PA mob grades 2, 3     Soft tissue mobilization  TPR and STM to Lt hip/glute/hip rotators  Passive ROM  Lt hip ext,  Lt hip IR/ER          PT Long Term Goals - 12/25/18 1116      PT LONG TERM GOAL #1   Title  independent with HEP    Status  On-going    Target Date  02/05/19      PT LONG TERM GOAL #2   Title  improve FOTO score to </= 40% limitation    Status  On-going    Target Date  02/05/19      PT LONG TERM GOAL #3   Title  demonstrate improved hamstring length with 90/90 test at least 25 degrees knee flexion or better    Status  On-going    Target Date  02/05/19      PT LONG TERM GOAL #4   Title  report pain < 5/10 with work activities (climbing ladders, lifting, carrying) for improved function    Status  On-going    Target Date  02/05/19            Plan - 01/15/19 0835    Clinical Impression Statement  Pt arrived with high pain level after reported prolonged sitting and no sleep.  Pt with limited tolerance for exercise and point tender Lt hip rotators with manual therapy.  Pain reduced with intermittent laps around gym and ice/estim at end of session.  No new goals met this date due to flare up.    Rehab Potential  Good    PT Frequency  2x / week    PT  Duration  6 weeks    PT Treatment/Interventions  ADLs/Self Care Home Management;Cryotherapy;Electrical Stimulation;Moist Heat;Therapeutic exercise;Therapeutic activities;Functional mobility training;Stair training;Gait training;Ultrasound;Neuromuscular re-education;Patient/family education;Manual techniques;Taping;Dry needling    PT Next Visit Plan  assess response to manual therapy/ DN to Lt hip rotators, Rt ant hip.    PT Home Exercise Plan  Access Code: [email or web address]    Consulted and Agree with Plan of Care  Patient       Patient will benefit from skilled therapeutic intervention in order to improve the following deficits and impairments:  Abnormal gait, Hypomobility, Pain, Decreased range of motion, Impaired flexibility, Postural dysfunction, Decreased strength  Visit Diagnosis: 1. Chronic left-sided low back pain without sciatica   2. Muscle weakness (generalized)   3. Abnormal posture        Problem List There are no active problems to display for this patient.  Kerin Perna, PTA 01/15/19 12:24 PM  [facility] [place], [place], [address] Phone: [phone]   Fax:  [phone]  Name: Johnny Arnold MRN: [id number] Date of Birth: 05/05/1956

## 2019-01-17 DIAGNOSIS — M5416 Radiculopathy, lumbar region: Secondary | ICD-10-CM | POA: Diagnosis not present

## 2019-01-22 ENCOUNTER — Other Ambulatory Visit: Payer: Self-pay

## 2019-01-22 ENCOUNTER — Encounter: Payer: Self-pay | Admitting: Physical Therapy

## 2019-01-22 ENCOUNTER — Ambulatory Visit: Payer: BLUE CROSS/BLUE SHIELD | Admitting: Physical Therapy

## 2019-01-22 DIAGNOSIS — R293 Abnormal posture: Secondary | ICD-10-CM | POA: Diagnosis not present

## 2019-01-22 DIAGNOSIS — M545 Low back pain, unspecified: Secondary | ICD-10-CM

## 2019-01-22 DIAGNOSIS — G8929 Other chronic pain: Secondary | ICD-10-CM

## 2019-01-22 DIAGNOSIS — M6281 Muscle weakness (generalized): Secondary | ICD-10-CM | POA: Diagnosis not present

## 2019-01-22 NOTE — Therapy (Signed)
[facility] [address] [place], [place], [address] Phone: [phone]   Fax:  [phone]  Physical Therapy Treatment  Patient Details  Name: Johnny Arnold MRN: [id number] Date of Birth: 19-Oct-1955 Referring Provider (PT): Gavin PoundPhelps, Cathy P, [place]   Encounter Date: 01/22/2019  PT End of Session - 01/22/19 [id number]    Visit Number  10    Number of Visits  12    Date for PT Re-Evaluation  02/05/19    PT Start Time  0845    PT Stop Time  0923    PT Time Calculation (min)  38 min    Activity Tolerance  Patient tolerated treatment well    Behavior During Therapy  [facility] for tasks assessed/performed       History reviewed. No pertinent past medical history.  History reviewed. No pertinent surgical history.  There were no vitals filed for this visit.  Subjective Assessment - 01/22/19 0849    Subjective  had injection on Friday; reports they injected the Rt side initially; then had to do the Lt side as they completed the wrong side.  overall reports no change in symptoms.    Patient Stated Goals  improve pain, work and play golf    Currently in Pain?  Yes    Pain Score  5     Pain Location  Sacrum    Pain Orientation  Left    Pain Descriptors / Indicators  Aching    Pain Type  Chronic pain    Pain Onset  More than a month ago    Pain Frequency  Constant    Aggravating Factors   being still too long    Pain Relieving Factors  medicine, walking around                       [facility] Adult PT Treatment/Exercise - 01/22/19 0853      Lumbar Exercises: Stretches   Active Hamstring Stretch  Right;Left;10 seconds   x10 reps   Active Hamstring Stretch Limitations  supine with knee flexion/extension    Passive Hamstring Stretch  Right;Left;3 reps;30 seconds   seated   Other Lumbar Stretch Exercise  sciatic nerve flossing with ankle pumps x 10 reps bil      Lumbar Exercises: Aerobic   Nustep  L5 x 6 min      Manual Therapy   Manual therapy comments  pt sidelying    Soft tissue mobilization  IASTM to Lt hip/glute/hip rotators        Trigger Point Dry Needling - 01/22/19 0928    Consent Given?  Yes    Education Handout Provided  Previously provided    Muscles Treated Back/Hip  Gluteus medius    Gluteus Medius Response  Twitch response elicited;Palpable increased muscle length                PT Long Term Goals - 12/25/18 1116      PT LONG TERM GOAL #1   Title  independent with HEP    Status  On-going    Target Date  02/05/19      PT LONG TERM GOAL #2   Title  improve FOTO score to </= 40% limitation    Status  On-going    Target Date  02/05/19      PT LONG TERM GOAL #3   Title  demonstrate improved hamstring length with 90/90 test at least 25 degrees knee flexion or better  Status  On-going    Target Date  02/05/19      PT LONG TERM GOAL #4   Title  report pain < 5/10 with work activities (climbing ladders, lifting, carrying) for improved function    Status  On-going    Target Date  02/05/19            Plan - 01/22/19 1035    Clinical Impression Statement  Pt reports min to no improvement following injection last week    Rehab Potential  Good    PT Frequency  2x / week    PT Duration  6 weeks    PT Treatment/Interventions  ADLs/Self Care Home Management;Cryotherapy;Electrical Stimulation;Moist Heat;Therapeutic exercise;Therapeutic activities;Functional mobility training;Stair training;Gait training;Ultrasound;Neuromuscular re-education;Patient/family education;Manual techniques;Taping;Dry needling    PT Next Visit Plan  try segmental mobilization, ant/post pelvic tilt sitting and quadruped, stretch lats    PT Home Exercise Plan  Access Code: [email or web address]    Consulted and Agree with Plan of Care  Patient       Patient will benefit from skilled therapeutic intervention in order to improve the following deficits and impairments:  Abnormal gait, Hypomobility, Pain,  Decreased range of motion, Impaired flexibility, Postural dysfunction, Decreased strength  Visit Diagnosis: Chronic left-sided low back pain without sciatica  Muscle weakness (generalized)  Abnormal posture     Problem List There are no active problems to display for this patient.     Laureen Abrahams, PT, DPT 01/22/19 10:43 AM     [facility] [place], [place], [address] Phone: [phone]   Fax:  [phone]  Name: Johnny Arnold MRN: [id number] Date of Birth: Dec 09, 1955

## 2019-01-29 ENCOUNTER — Encounter: Payer: Self-pay | Admitting: Physical Therapy

## 2019-01-29 ENCOUNTER — Ambulatory Visit (INDEPENDENT_AMBULATORY_CARE_PROVIDER_SITE_OTHER): Payer: BLUE CROSS/BLUE SHIELD | Admitting: Physical Therapy

## 2019-01-29 ENCOUNTER — Other Ambulatory Visit: Payer: Self-pay

## 2019-01-29 DIAGNOSIS — M6281 Muscle weakness (generalized): Secondary | ICD-10-CM

## 2019-01-29 DIAGNOSIS — R293 Abnormal posture: Secondary | ICD-10-CM

## 2019-01-29 DIAGNOSIS — G8929 Other chronic pain: Secondary | ICD-10-CM

## 2019-01-29 DIAGNOSIS — M545 Low back pain, unspecified: Secondary | ICD-10-CM

## 2019-01-29 NOTE — Therapy (Signed)
Johnny Arnold, [place], [address] Phone: [phone]   Fax:  [phone]  Physical Therapy Treatment/Recertification  Patient Details  Name: Johnny Arnold MRN: [id number] Date of Birth: 10/22/1955 Referring Provider (PT): Randolm Idol, [place]   Encounter Date: 01/29/2019  PT End of Session - 01/29/19 1058    Visit Number  11    Number of Visits  14    Date for PT Re-Evaluation  02/26/19    PT Start Time  1245    PT Stop Time  1055    PT Time Calculation (min)  40 min    Activity Tolerance  Patient tolerated treatment well    Behavior During Therapy  [facility] for tasks assessed/performed       History reviewed. No pertinent past medical history.  History reviewed. No pertinent surgical history.  There were no vitals filed for this visit.  Subjective Assessment - 01/29/19 1018    Subjective  feels like he's having more good days.  "I hate to say it, but that needling seems to work."    Patient Stated Goals  improve pain, work and play golf    Currently in Pain?  No/denies    Pain Onset  More than a month ago         [facility] PT Assessment - 01/29/19 1213      Assessment   Medical Diagnosis  lumbar spondylosis    Referring Provider (PT)  Randolm Idol, PA-C      Observation/Other Assessments   Focus on Therapeutic Outcomes (FOTO)   53 (47% limited)                   [facility] Adult PT Treatment/Exercise - 01/29/19 1020      Lumbar Exercises: Stretches   Passive Hamstring Stretch  Right;Left;3 reps;30 seconds   seated   Single Knee to Chest Stretch  --   10 x 10 sec   Piriformis Stretch  Right;Left;2 reps;20 seconds      Lumbar Exercises: Aerobic   Nustep  L5 x 6 min      Manual Therapy   Joint Mobilization  Lt hip LAD and A/P mobs    Soft tissue mobilization  Lt glutes       Trigger Point Dry Needling - 01/29/19 1056    Consent Given?  Yes    Education Handout Provided   Previously provided    Muscles Treated Back/Hip  Gluteus medius    Gluteus Medius Response  Twitch response elicited;Palpable increased muscle length                PT Long Term Goals - 01/29/19 1215      PT LONG TERM GOAL #1   Title  independent with HEP    Status  On-going    Target Date  02/26/19      PT LONG TERM GOAL #2   Title  improve FOTO score to </= 40% limitation    Status  On-going    Target Date  02/26/19      PT LONG TERM GOAL #3   Title  demonstrate improved hamstring length with 90/90 test at least 25 degrees knee flexion or better    Status  On-going    Target Date  02/26/19      PT LONG TERM GOAL #4   Title  report pain < 5/10 with work activities (climbing ladders, lifting, carrying) for improved function    Status  On-going    Target Date  02/26/19            Plan - 01/29/19 1216    Clinical Impression Statement  Pt is demonstrating slow progress with PT.  At this time recommend PT 1x/wk x 4 more weeks to progress HEP and maximize functional mobility.    Rehab Potential  Good    PT Frequency  1x / week    PT Duration  4 weeks    PT Treatment/Interventions  ADLs/Self Care Home Management;Cryotherapy;Electrical Stimulation;Moist Heat;Therapeutic exercise;Therapeutic activities;Functional mobility training;Stair training;Gait training;Ultrasound;Neuromuscular re-education;Patient/family education;Manual techniques;Taping;Dry needling    PT Next Visit Plan  try segmental mobilization, ant/post pelvic tilt sitting and quadruped, stretch lats; look at HS length    PT Home Exercise Plan  Access Code: [email or web address]    Consulted and Agree with Plan of Care  Patient       Patient will benefit from skilled therapeutic intervention in order to improve the following deficits and impairments:  Abnormal gait, Hypomobility, Pain, Decreased range of motion, Impaired flexibility, Postural dysfunction, Decreased strength  Visit Diagnosis: Chronic left-sided low  back pain without sciatica - Plan: PT plan of care cert/re-cert  Muscle weakness (generalized) - Plan: PT plan of care cert/re-cert  Abnormal posture - Plan: PT plan of care cert/re-cert     Problem List There are no active problems to display for this patient.    Clarita CraneStephanie F Tramya Schoenfelder, PT, DPT 01/29/19 12:19 PM     [facility] [address] [place], [place], [address] Phone: [phone]   Fax:  [phone]  Name: Johnny GulaRichard Kienast MRN: [id number] Date of Birth: [birth year]

## 2019-02-12 ENCOUNTER — Other Ambulatory Visit: Payer: Self-pay

## 2019-02-12 ENCOUNTER — Encounter: Payer: Self-pay | Admitting: Physical Therapy

## 2019-02-12 ENCOUNTER — Ambulatory Visit: Payer: BLUE CROSS/BLUE SHIELD | Admitting: Physical Therapy

## 2019-02-12 DIAGNOSIS — R293 Abnormal posture: Secondary | ICD-10-CM

## 2019-02-12 DIAGNOSIS — M6281 Muscle weakness (generalized): Secondary | ICD-10-CM | POA: Diagnosis not present

## 2019-02-12 DIAGNOSIS — G8929 Other chronic pain: Secondary | ICD-10-CM | POA: Diagnosis not present

## 2019-02-12 DIAGNOSIS — E291 Testicular hypofunction: Secondary | ICD-10-CM | POA: Diagnosis not present

## 2019-02-12 DIAGNOSIS — Z23 Encounter for immunization: Secondary | ICD-10-CM | POA: Diagnosis not present

## 2019-02-12 DIAGNOSIS — M545 Low back pain, unspecified: Secondary | ICD-10-CM

## 2019-02-12 NOTE — Therapy (Signed)
[place], [place], [address] Phone: [phone]   Fax:  [phone]  Physical Therapy Treatment  Patient Details  Name: Johnny Arnold MRN: [id number] Date of Birth: May 09, 1956 Referring Provider (PT): Randolm Idol, [place]   Encounter Date: 02/12/2019  PT End of Session - 02/12/19 0936    Visit Number  12    Number of Visits  14    Date for PT Re-Evaluation  02/26/19    PT Start Time  4562    PT Stop Time  0928    PT Time Calculation (min)  41 min    Activity Tolerance  Patient tolerated treatment well    Behavior During Therapy  [facility] for tasks assessed/performed       History reviewed. No pertinent past medical history.  History reviewed. No pertinent surgical history.  There were no vitals filed for this visit.  Subjective Assessment - 02/12/19 0851    Subjective  turning the wrong way is what seems to aggravate the back now; "it's better than when I started."  still has days of increased pain.    Patient Stated Goals  improve pain, work and play golf    Currently in Pain?  Yes    Pain Score  2     Pain Location  Sacrum    Pain Orientation  Left    Pain Descriptors / Indicators  Aching    Pain Type  Chronic pain    Pain Onset  More than a month ago    Pain Frequency  Constant    Aggravating Factors   being still too long    Pain Relieving Factors  medicine, walking around                       [facility] Adult PT Treatment/Exercise - 02/12/19 0853      Lumbar Exercises: Stretches   Passive Hamstring Stretch  Right;Left;3 reps;30 seconds   seated   Quadruped Mid Back Stretch  3 reps;30 seconds   mid/Lt/Rt   Quadruped Mid Back Stretch Limitations  seated ball roll    Piriformis Stretch  Right;Left;2 reps;20 seconds      Lumbar Exercises: Aerobic   Tread Mill  2.0 mph x 5 min      Lumbar Exercises: Seated   Other Seated Lumbar Exercises  ant/post pelvic tilts x10  reps (3 sec hold)      Lumbar Exercises: Supine   Bridge  10 reps    Bridge Limitations  with strap for isometric hip abduction    Other Supine Lumbar Exercises  mini crunch 2x10    Other Supine Lumbar Exercises  pelvic tilts on 1/2 foam roll moving roll up spine every 3-5 reps      Lumbar Exercises: Quadruped   [place]  10 reps   3-5 sec hold                 PT Long Term Goals - 01/29/19 1215      PT LONG TERM GOAL #1   Title  independent with HEP    Status  On-going    Target Date  02/26/19      PT LONG TERM GOAL #2   Title  improve FOTO score to </= 40% limitation    Status  On-going    Target Date  02/26/19      PT LONG TERM GOAL #3   Title  demonstrate improved hamstring length  with 90/90 test at least 25 degrees knee flexion or better    Status  On-going    Target Date  02/26/19      PT LONG TERM GOAL #4   Title  report pain < 5/10 with work activities (climbing ladders, lifting, carrying) for improved function    Status  On-going    Target Date  02/26/19            Plan - 02/12/19 0936    Clinical Impression Statement  Pt with no pain at end of session today, and overall reports improvement in pain since starting PT.  Pt to follow up with MD tomorrow to discuss further options.    Rehab Potential  Good    PT Frequency  1x / week    PT Duration  4 weeks    PT Treatment/Interventions  ADLs/Self Care Home Management;Cryotherapy;Electrical Stimulation;Moist Heat;Therapeutic exercise;Therapeutic activities;Functional mobility training;Stair training;Gait training;Ultrasound;Neuromuscular re-education;Patient/family education;Manual techniques;Taping;Dry needling    PT Next Visit Plan  ant/post pelvic tilt sitting and quadruped, stretch lats; look at HS length    PT Home Exercise Plan  Access Code: [email or web address]    Consulted and Agree with Plan of Care  Patient       Patient will benefit from skilled therapeutic intervention in order to improve  the following deficits and impairments:  Abnormal gait, Hypomobility, Pain, Decreased range of motion, Impaired flexibility, Postural dysfunction, Decreased strength  Visit Diagnosis: Chronic left-sided low back pain without sciatica  Muscle weakness (generalized)  Abnormal posture     Problem List There are no active problems to display for this patient.     Clarita CraneStephanie F Char Feltman, PT, DPT 02/12/19 9:38 AM     [facility] [address] [place], [place], [address] Phone: [phone]   Fax:  [phone]  Name: Joanette GulaRichard Lemler MRN: [id number] Date of Birth: [birth year]

## 2019-02-13 DIAGNOSIS — M545 Low back pain: Secondary | ICD-10-CM | POA: Diagnosis not present

## 2019-02-13 DIAGNOSIS — M5416 Radiculopathy, lumbar region: Secondary | ICD-10-CM | POA: Diagnosis not present

## 2019-02-19 ENCOUNTER — Encounter: Payer: BLUE CROSS/BLUE SHIELD | Admitting: Physical Therapy

## 2019-02-19 DIAGNOSIS — E291 Testicular hypofunction: Secondary | ICD-10-CM | POA: Diagnosis not present

## 2019-02-19 DIAGNOSIS — Z0000 Encounter for general adult medical examination without abnormal findings: Secondary | ICD-10-CM | POA: Diagnosis not present

## 2019-02-19 DIAGNOSIS — E7800 Pure hypercholesterolemia, unspecified: Secondary | ICD-10-CM | POA: Diagnosis not present

## 2019-02-19 DIAGNOSIS — M109 Gout, unspecified: Secondary | ICD-10-CM | POA: Diagnosis not present

## 2019-02-19 DIAGNOSIS — I10 Essential (primary) hypertension: Secondary | ICD-10-CM | POA: Diagnosis not present

## 2019-02-19 DIAGNOSIS — M5416 Radiculopathy, lumbar region: Secondary | ICD-10-CM | POA: Diagnosis not present

## 2019-02-26 ENCOUNTER — Other Ambulatory Visit: Payer: Self-pay

## 2019-02-26 ENCOUNTER — Encounter: Payer: Self-pay | Admitting: Physical Therapy

## 2019-02-26 ENCOUNTER — Ambulatory Visit (INDEPENDENT_AMBULATORY_CARE_PROVIDER_SITE_OTHER): Payer: BLUE CROSS/BLUE SHIELD | Admitting: Physical Therapy

## 2019-02-26 DIAGNOSIS — M545 Low back pain, unspecified: Secondary | ICD-10-CM

## 2019-02-26 DIAGNOSIS — M6281 Muscle weakness (generalized): Secondary | ICD-10-CM

## 2019-02-26 DIAGNOSIS — R293 Abnormal posture: Secondary | ICD-10-CM

## 2019-02-26 DIAGNOSIS — G8929 Other chronic pain: Secondary | ICD-10-CM | POA: Diagnosis not present

## 2019-02-26 NOTE — Therapy (Signed)
[facility] [address] [place], [place], [address] Phone: [phone]   Fax:  [phone]  Physical Therapy Treatment/Discharge Summary  Patient Details  Name: Johnny Arnold MRN: [id number] Date of Birth: 09/22/1955 Referring Provider (PT): Randolm Idol, [place]   Encounter Date: 02/26/2019  PT End of Session - 02/26/19 7353    Visit Number  13    Number of Visits  14    Date for PT Re-Evaluation  02/26/19    PT Start Time  0845    PT Stop Time  0925    PT Time Calculation (min)  40 min    Activity Tolerance  Patient tolerated treatment well    Behavior During Therapy  [facility] for tasks assessed/performed       History reviewed. No pertinent past medical history.  History reviewed. No pertinent surgical history.  There were no vitals filed for this visit.  Subjective Assessment - 02/26/19 0846    Subjective  had injection last week - ineffective.  doesn't feel like he's made much improvement.  pain still up to 7-8/10 at times.    Patient Stated Goals  improve pain, work and play golf    Currently in Pain?  Yes    Pain Score  5     Pain Location  [place]    Pain Orientation  Left    Pain Descriptors / Indicators  Aching    Pain Type  Chronic pain    Pain Radiating Towards  tingling to Lt ankle occasionally    Pain Onset  More than a month ago    Pain Frequency  Constant    Aggravating Factors   being still too long    Pain Relieving Factors  medicine, walking around         [facility] PT Assessment - 02/26/19 0858      Assessment   Medical Diagnosis  lumbar spondylosis    Referring Provider (PT)  Randolm Idol, PA-C      Observation/Other Assessments   Focus on Therapeutic Outcomes (FOTO)   39 (61% limited)      Flexibility   Hamstrings  90/90 test: RLE 32 deg, LLE 27 deg                   [facility] Adult PT Treatment/Exercise - 02/26/19 0850      Lumbar Exercises: Stretches   Passive  Hamstring Stretch  Right;Left;3 reps;30 seconds   seated   Double Knee to Chest Stretch  3 reps;20 seconds    Double Knee to Chest Stretch Limitations  with rocking    Piriformis Stretch  Right;Left;2 reps;20 seconds    Other Lumbar Stretch Exercise  seated overhead reach 3x30 sec bil      Lumbar Exercises: Aerobic   Nustep  L5 x 6 min      Lumbar Exercises: Supine   Bridge  15 reps;5 seconds    Isometric Hip Flexion  20 reps;5 seconds    Other Supine Lumbar Exercises  mini crunch 2x10                  PT Long Term Goals - 02/26/19 2992      PT LONG TERM GOAL #1   Title  independent with HEP    Status  Achieved      PT LONG TERM GOAL #2   Title  improve FOTO score to </= 40% limitation    Status  Not Met  PT LONG TERM GOAL #3   Title  demonstrate improved hamstring length with 90/90 test at least 25 degrees knee flexion or better    Baseline  improved; not quite to goal    Status  Not Met      PT LONG TERM GOAL #4   Title  report pain < 5/10 with work activities (climbing ladders, lifting, carrying) for improved function    Status  Not Met            Plan - 02/26/19 1041    Clinical Impression Statement  Pt arrived today after 2nd injection reporting no benefit and pain is still about the same.  At this time recommended d/c from PT with plan to f/u with MD about possible MRI.  Pt in agreement.    Rehab Potential  Good    PT Frequency  1x / week    PT Duration  4 weeks    PT Treatment/Interventions  ADLs/Self Care Home Management;Cryotherapy;Electrical Stimulation;Moist Heat;Therapeutic exercise;Therapeutic activities;Functional mobility training;Stair training;Gait training;Ultrasound;Neuromuscular re-education;Patient/family education;Manual techniques;Taping;Dry needling    PT Next Visit Plan  d/c PT today    PT Home Exercise Plan  Access Code: [email or web address]    Consulted and Agree with Plan of Care  Patient       Patient will benefit from skilled  therapeutic intervention in order to improve the following deficits and impairments:  Abnormal gait, Hypomobility, Pain, Decreased range of motion, Impaired flexibility, Postural dysfunction, Decreased strength  Visit Diagnosis: Chronic left-sided low back pain without sciatica  Muscle weakness (generalized)  Abnormal posture     Problem List There are no active problems to display for this patient.   Matthews, Stephanie K 02/26/2019, 10:42 AM  [facility] [address] [place], [place], [address] Phone: [phone]   Fax:  [phone]  Name: Johnny Arnold MRN: [id number] Date of Birth: 04/13/1956     PHYSICAL THERAPY DISCHARGE SUMMARY  Visits from Start of Care: 13  Current functional level related to goals / functional outcomes: See above   Remaining deficits: See above   Education / Equipment: HEP  Plan: Patient agrees to discharge.  Patient goals were not met. Patient is being discharged due to lack of progress.  ?????    Stephanie F Matthews, PT, DPT 02/26/19 10:43 AM  [facility] [address] [place], [place] [address]  [phone] (office) [phone] (fax)   

## 2019-03-12 DIAGNOSIS — Z135 Encounter for screening for eye and ear disorders: Secondary | ICD-10-CM | POA: Diagnosis not present

## 2019-03-12 DIAGNOSIS — H25813 Combined forms of age-related cataract, bilateral: Secondary | ICD-10-CM | POA: Diagnosis not present

## 2019-03-12 DIAGNOSIS — H5203 Hypermetropia, bilateral: Secondary | ICD-10-CM | POA: Diagnosis not present

## 2019-03-12 DIAGNOSIS — H524 Presbyopia: Secondary | ICD-10-CM | POA: Diagnosis not present

## 2019-03-20 DIAGNOSIS — M545 Low back pain: Secondary | ICD-10-CM | POA: Diagnosis not present

## 2019-03-20 DIAGNOSIS — M5416 Radiculopathy, lumbar region: Secondary | ICD-10-CM | POA: Diagnosis not present

## 2019-06-03 HISTORY — PX: OTHER SURGICAL HISTORY: SHX169

## 2019-06-09 ENCOUNTER — Ambulatory Visit: Payer: BLUE CROSS/BLUE SHIELD | Admitting: Physical Therapy

## 2019-06-20 ENCOUNTER — Other Ambulatory Visit: Payer: Self-pay

## 2019-06-20 ENCOUNTER — Encounter: Payer: Self-pay | Admitting: Physical Therapy

## 2019-06-20 ENCOUNTER — Ambulatory Visit (INDEPENDENT_AMBULATORY_CARE_PROVIDER_SITE_OTHER): Payer: Medicare Other | Admitting: Physical Therapy

## 2019-06-20 DIAGNOSIS — R262 Difficulty in walking, not elsewhere classified: Secondary | ICD-10-CM

## 2019-06-20 DIAGNOSIS — M25552 Pain in left hip: Secondary | ICD-10-CM

## 2019-06-20 DIAGNOSIS — M6281 Muscle weakness (generalized): Secondary | ICD-10-CM

## 2019-06-20 NOTE — Therapy (Signed)
[facility] [place] [address] [place], [place], [address] Phone: [phone]   Fax:  [phone]  Physical Therapy Evaluation  Patient Details  Name: Johnny Arnold MRN: [id number] Date of Birth: [birth year] Referring Provider (PT): Waylan Boga, MD   Encounter Date: 06/20/2019  PT End of Session - 06/20/19 1024    Visit Number  1    Number of Visits  7    Date for PT Re-Evaluation  08/15/19    PT Start Time  0930    PT Stop Time  1013    PT Time Calculation (min)  43 min    Activity Tolerance  Patient tolerated treatment well    Behavior During Therapy  [facility] for tasks assessed/performed       History reviewed. No pertinent past medical history.  Past Surgical History:  Procedure Laterality Date  . left total hip arthroplasty Left 06/03/2019    There were no vitals filed for this visit.   Subjective Assessment - 06/20/19 0932    Subjective  Pt arriving to therapy reporting L thigh pain after L hip replacement on Tuesday 06/03/2019. Pt states he wants to get back to playing golf.    Pertinent History  h/o LBP without sciatica, s/p L hip replacement on 06/03/2019.    How long can you sit comfortably?  unlimited in recliner    How long can you walk comfortably?  4 blocks each day    Patient Stated Goals  Play golf    Currently in Pain?  Yes    Pain Score  3     Pain Location  --   thigh pain - left   Pain Orientation  Left    Pain Descriptors / Indicators  Aching    Pain Type  Acute pain;Surgical pain    Aggravating Factors   turning, stepping wrong    Pain Relieving Factors  sitting in my recliner    Effect of Pain on Daily Activities  community activitiy, I have to prop my foot on something to tie my shoes         [facility] PT Assessment - 06/20/19 0001      Assessment   Medical Diagnosis  Left total hip replacement on 06/03/2019    Referring Provider (PT)  Waylan Boga, MD    Onset Date/Surgical Date   06/03/19    Hand Dominance  Right    Prior Therapy  yes, for LBP      Precautions   Precautions  Anterior Hip      Restrictions   Weight Bearing Restrictions  No      Balance Screen   Has the patient fallen in the past 6 months  No    Is the patient reluctant to leave their home because of a fear of falling?   No      Home Environment   Living Environment  Private residence    Living Arrangements  Spouse/significant other    Type of Home  House    Home Access  Level entry    Entrance Stairs-Number of Steps  15    Entrance Stairs-Rails  Right    Home Layout  Two level      Prior Function   Level of Independence  Independent    Vocation  Full time employment    Vocation Requirements  Pt owns his own company, [employer]    Leisure  play golf, fish, garden      Cognition  Overall Cognitive Status  Within Functional Limits for tasks assessed      Observation/Other Assessments   Focus on Therapeutic Outcomes (FOTO)   49% limitation      Posture/Postural Control   Posture/Postural Control  Postural limitations    Postural Limitations  Rounded Shoulders;Forward head      ROM / Strength   AROM / PROM / Strength  AROM;Strength      AROM   AROM Assessment Site  Hip    Right/Left Hip  Left    Left Hip Flexion  90      Strength   Strength Assessment Site  Hip;Knee    Right/Left Hip  Right;Left    Right Hip Flexion  4/5    Right Hip External Rotation   4/5    Right Hip Internal Rotation  4/5    Right Hip ABduction  4/5    Right Hip ADduction  4/5    Left Hip Flexion  3+/5    Left Hip External Rotation  3+/5    Left Hip Internal Rotation  3+/5    Left Hip ABduction  3+/5    Left Hip ADduction  3+/5    Right/Left Knee  Right;Left    Right Knee Flexion  5/5    Right Knee Extension  5/5    Left Knee Flexion  5/5    Left Knee Extension  5/5      Flexibility   Soft Tissue Assessment /Muscle Length  yes    Hamstrings  SLR oppposite knee bent: R: 68 degrees .L 56  degrees      Palpation   Palpation comment  TTP over incision site and anterior lateral thigh      Ambulation/Gait   Assistive device  None    Gait Pattern  Step-through pattern;Decreased step length - right;Decreased step length - left;Decreased stance time - left;Decreased hip/knee flexion - left;Antalgic;Poor foot clearance - left      High Level Balance   High Level Balance Comments  SLS: Left 3 seconds                Objective measurements completed on examination: See above findings.              PT Education - 06/20/19 0940    Education Details  PT POC, HEP    Person(s) Educated  Patient    Methods  Explanation;Demonstration;Handout    Comprehension  Verbalized understanding;Returned demonstration          PT Long Term Goals - 06/20/19 1041      PT LONG TERM GOAL #1   Title  Pt will be independent in his HEP and progression.    Baseline  issued new HEP on 06/20/2019    Time  6    Period  Weeks    Status  New    Target Date  08/01/19      PT LONG TERM GOAL #2   Title  Pt will improve his FOTO score from 49% limitation to </=29 % limitation.    Time  6    Period  Weeks    Status  New    Target Date  08/01/19      PT LONG TERM GOAL #3   Title  Pt will improve his L SLS to >/= 10 seconds in order to improve functional mobility.    Baseline  3 seconds    Time  6    Period  Weeks    Status  New    Target Date  08/01/19      PT LONG TERM GOAL #4   Title  Pt will be able to amb 1 mile with no pain reported with normalized gait pattern.    Baseline  antalgic gait walking 1 block 3 times each day.    Time  6    Period  Weeks    Status  New    Target Date  08/01/19      PT LONG TERM GOAL #5   Title  Pt will improve his L LE hip strength to >/= 4+/5 in order to improve gait and funtional mobility.    Baseline  see flowsheets    Time  6    Period  Weeks    Status  New    Target Date  08/01/19             Plan - 06/20/19 0958     Clinical Impression Statement  Pt arriving to therapy for PT evaluation following L total hip replacement on 06/03/2019. Pt reporting 3/10 thigh pain. Pt arriving with current med list, but unsure of dosages. Pt was instructed to bring med list back at next visit or take pictures of his med bottles for the therapist to enter. Pt presenting with decreased strength in L hip with flexion, Abduction/adduction and IR and ER. Pt with antalgic gait pattern with decreased stance phase on L LE and decreased foot clearance. Pt amb with no device and his goal is to return to golf. Pt's current HEP was reviwed and 3 new exercises added. Pt would benefit from skilled PT to progress toward his PLOF and to address his impairments with the below interventions.    Personal Factors and Comorbidities  Comorbidity 2    Comorbidities  h/o chronic LBP, arthritis, COPD, s/p L THR on 06/03/2019    Examination-Activity Limitations  Stairs;Squat;Lift    Examination-Participation Restrictions  Other;Community Activity    Stability/Clinical Decision Making  Stable/Uncomplicated    Clinical Decision Making  Low    Rehab Potential  Excellent    PT Frequency  1x / week    PT Duration  6 weeks    PT Treatment/Interventions  ADLs/Self Care Home Management;Cryotherapy;Electrical Stimulation;Moist Heat;Ultrasound;Gait training;Stair training;Functional mobility training;Neuromuscular re-education;Balance training;Therapeutic exercise;Therapeutic activities;Cognitive remediation;Patient/family education;Manual techniques;Taping;Dry needling    PT Next Visit Plan  Begin LE strengthening, Nustep, gait training and balance exercises    PT Home Exercise Plan  Access Code: [email or web address]    Consulted and Agree with Plan of Care  Patient       Patient will benefit from skilled therapeutic intervention in order to improve the following deficits and impairments:  Pain, Decreased strength, Decreased range of motion, Difficulty walking, Abnormal gait,  Decreased mobility, Impaired flexibility  Visit Diagnosis: Pain in left hip  Muscle weakness (generalized)  Difficulty in walking, not elsewhere classified     Problem List There are no problems to display for this patient.   Oretha Caprice, PT 06/20/2019, 10:53 AM  [facility] [place] [facility] [place], [place], [address] Phone: [phone]   Fax:  [phone]  Name: Johnny Arnold MRN: [id number] Date of Birth: 06-Mar-1956

## 2019-06-27 ENCOUNTER — Ambulatory Visit (INDEPENDENT_AMBULATORY_CARE_PROVIDER_SITE_OTHER): Payer: Medicare Other | Admitting: Physical Therapy

## 2019-06-27 ENCOUNTER — Encounter: Payer: Self-pay | Admitting: Physical Therapy

## 2019-06-27 ENCOUNTER — Other Ambulatory Visit: Payer: Self-pay

## 2019-06-27 DIAGNOSIS — M6281 Muscle weakness (generalized): Secondary | ICD-10-CM

## 2019-06-27 DIAGNOSIS — M25552 Pain in left hip: Secondary | ICD-10-CM

## 2019-06-27 DIAGNOSIS — M545 Low back pain, unspecified: Secondary | ICD-10-CM

## 2019-06-27 DIAGNOSIS — R262 Difficulty in walking, not elsewhere classified: Secondary | ICD-10-CM

## 2019-06-27 DIAGNOSIS — R293 Abnormal posture: Secondary | ICD-10-CM

## 2019-06-27 DIAGNOSIS — G8929 Other chronic pain: Secondary | ICD-10-CM

## 2019-06-27 NOTE — Therapy (Signed)
[facility] [place] [address] [place], [place], [address] Phone: [phone]   Fax:  [phone]  Physical Therapy Treatment  Patient Details  Name: Johnny Arnold MRN: [id number] Date of Birth: 1956-02-06 Referring Provider (PT): Waylan Boga, MD   Encounter Date: 06/27/2019  PT End of Session - 06/27/19 0916    Visit Number  2    Number of Visits  7    Date for PT Re-Evaluation  08/15/19    PT Start Time  0845    PT Stop Time  0930    PT Time Calculation (min)  45 min    Activity Tolerance  Patient tolerated treatment well    Behavior During Therapy  [facility] for tasks assessed/performed       History reviewed. No pertinent past medical history.  Past Surgical History:  Procedure Laterality Date  . left total hip arthroplasty Left 06/03/2019    There were no vitals filed for this visit.  Subjective Assessment - 06/27/19 0856    Subjective  Pt arriving to therapy reporting 4/10 pain in his left hip and anterior thigh.    Pertinent History  h/o LBP without sciatica, s/p L hip replacement on 06/03/2019.    How long can you sit comfortably?  unlimited in recliner    How long can you walk comfortably?  4 blocks each day    Patient Stated Goals  Play golf    Currently in Pain?  Yes    Pain Score  4     Pain Orientation  Left    Pain Descriptors / Indicators  Aching    Pain Type  Acute pain;Surgical pain         [facility] PT Assessment - 06/27/19 0001      Assessment   Medical Diagnosis  Left total hip replacement on 06/03/2019    Referring Provider (PT)  Waylan Boga, MD    Onset Date/Surgical Date  06/03/19    Hand Dominance  Right    Prior Therapy  yes, for LBP      Precautions   Precautions  Anterior Hip      Restrictions   Weight Bearing Restrictions  No      Balance Screen   Has the patient fallen in the past 6 months  No    Is the patient reluctant to leave their home because of a fear of falling?    No      Home Environment   Living Environment  Private residence    Living Arrangements  Spouse/significant other    Type of Home  House      Posture/Postural Control   Posture/Postural Control  Postural limitations                   [facility] Adult PT Treatment/Exercise - 06/27/19 0001      Exercises   Exercises  Knee/Hip      Knee/Hip Exercises: Stretches   Active Hamstring Stretch  Left;3 reps;Limitations    Gastroc Stretch  Both;2 reps;20 seconds      Knee/Hip Exercises: Aerobic   Nustep  L4, UE and LE x 6 minutes      Knee/Hip Exercises: Standing   Heel Raises  Right;Left;15 reps    Forward Lunges  Left;15 reps    Forward Lunges Limitations  L foot on 8 inch step    Lateral Step Up  Left;15 reps;Hand Hold: 1    Forward Step Up  Both;15 reps;Hand Hold: 1  Other Standing Knee Exercises  rocking foward and back and side to side on the BOSU ball dome down x 1 minute each with UE support.       Knee/Hip Exercises: Supine   Heel Slides  AROM;Strengthening;10 reps    Bridges  Strengthening;15 reps    Bridges Limitations  holding 5 seconds    Straight Leg Raises  Strengthening;Left;2 sets;10 reps;Limitations    Straight Leg Raises Limitations  3#      Knee/Hip Exercises: Prone   Hamstring Curl  2 sets;10 reps;Limitations    Hamstring Curl Limitations  3# ankle weight                  PT Long Term Goals - 06/27/19 0921      PT LONG TERM GOAL #1   Title  Pt will be independent in his HEP and progression.    Baseline  issued new HEP on 06/20/2019    Time  6    Period  Weeks    Status  On-going      PT LONG TERM GOAL #2   Title  Pt will improve his FOTO score from 49% limitation to </=29 % limitation.    Time  6    Period  Weeks    Status  On-going      PT LONG TERM GOAL #3   Title  Pt will improve his L SLS to >/= 10 seconds in order to improve functional mobility.    Baseline  3 seconds    Time  6    Period  Weeks    Status  On-going       PT LONG TERM GOAL #4   Title  Pt will be able to amb 1 mile with no pain reported with normalized gait pattern.    Baseline  antalgic gait walking 1 block 3 times each day.    Time  6    Period  Weeks    Status  On-going      PT LONG TERM GOAL #5   Title  Pt will improve his L LE hip strength to >/= 4+/5 in order to improve gait and funtional mobility.    Baseline  see flowsheets    Time  6    Status  On-going            Plan - 06/27/19 2703    Clinical Impression Statement  Pt arriving today reproting pain in L lateral hip and anterior thigh. Pt tolerating all exercises well. Pt progressing with more strengthening exercises today. Pt reported he has been doing some light chipping on the golf course with no increase in pain. Pt was advised to progress slowly and to hold off a few more week until we can progress his strength to prevent compensation and he will not violate his hip precautions. Conitnue skilled PT to progress with more dynamic balance and LE strengthening.    Personal Factors and Comorbidities  Comorbidity 2    Comorbidities  h/o chronic LBP, arthritis, COPD, s/p L THR on 06/03/2019    Examination-Participation Restrictions  Other;Community Activity    Stability/Clinical Decision Making  Stable/Uncomplicated    Rehab Potential  Excellent    PT Frequency  1x / week    PT Treatment/Interventions  ADLs/Self Care Home Management;Cryotherapy;Electrical Stimulation;Moist Heat;Ultrasound;Gait training;Stair training;Functional mobility training;Neuromuscular re-education;Balance training;Therapeutic exercise;Therapeutic activities;Cognitive remediation;Patient/family education;Manual techniques;Taping;Dry needling    PT Next Visit Plan  Begin LE strengthening, Nustep, gait training and balance exercises  PT Home Exercise Plan  Access Code: [email or web address]    Consulted and Agree with Plan of Care  Patient       Patient will benefit from skilled therapeutic intervention in order  to improve the following deficits and impairments:  Pain, Decreased strength, Decreased range of motion, Difficulty walking, Abnormal gait, Decreased mobility, Impaired flexibility  Visit Diagnosis: Pain in left hip  Muscle weakness (generalized)  Difficulty in walking, not elsewhere classified  Chronic left-sided low back pain without sciatica  Abnormal posture     Problem List There are no problems to display for this patient.   Sharmon Leyden, PT 06/27/2019, 9:28 AM  [facility] [address] [place], [place], [address] Phone: [phone]   Fax:  [phone]  Name: Maliq Pilley MRN: [id number] Date of Birth: 11/10/1955

## 2019-07-04 ENCOUNTER — Ambulatory Visit (INDEPENDENT_AMBULATORY_CARE_PROVIDER_SITE_OTHER): Payer: Medicare Other | Admitting: Physical Therapy

## 2019-07-04 ENCOUNTER — Encounter: Payer: Self-pay | Admitting: Physical Therapy

## 2019-07-04 ENCOUNTER — Other Ambulatory Visit: Payer: Self-pay

## 2019-07-04 DIAGNOSIS — R262 Difficulty in walking, not elsewhere classified: Secondary | ICD-10-CM

## 2019-07-04 DIAGNOSIS — M6281 Muscle weakness (generalized): Secondary | ICD-10-CM

## 2019-07-04 DIAGNOSIS — M25552 Pain in left hip: Secondary | ICD-10-CM

## 2019-07-04 NOTE — Therapy (Signed)
[place], [place], [address] Phone: [phone]   Fax:  [phone]  Physical Therapy Treatment  Patient Details  Name: Johnny Arnold MRN: [id number] Date of Birth: [birth year] Referring Provider (PT): Hinda Lenis, MD   Encounter Date: 07/04/2019  PT End of Session - 07/04/19 0940    Visit Number  3    Number of Visits  7    Date for PT Re-Evaluation  08/15/19    PT Start Time  0929    PT Stop Time  1013    PT Time Calculation (min)  44 min    Activity Tolerance  Patient tolerated treatment well    Behavior During Therapy  [facility] for tasks assessed/performed       History reviewed. No pertinent past medical history.  Past Surgical History:  Procedure Laterality Date  . left total hip arthroplasty Left 06/03/2019    There were no vitals filed for this visit.  Subjective Assessment - 07/04/19 0936    Subjective  Pt reports he is getting better all the time.  He is pleased with his progress. He is anxious to return to golfing.    Currently in Pain?  Yes    Pain Score  3     Pain Location  Leg   lateral to ant thigh   Pain Orientation  Left    Pain Descriptors / Indicators  Sore    Aggravating Factors   too much walking    Pain Relieving Factors  ice, Tylenol.         [facility] PT Assessment - 07/04/19 0001      Assessment   Medical Diagnosis  Left total hip replacement on 06/03/2019    Referring Provider (PT)  Hinda Lenis, MD    Onset Date/Surgical Date  06/03/19    Hand Dominance  Right    Prior Therapy  yes, for LBP      AROM   Right/Left Hip  Left   measured in standing   Left Hip Flexion  90      Strength   Right/Left Hip  Right;Left    Right Hip Extension  3+/5    Left Hip Flexion  --   5-/5   Left Hip Extension  4/5    Left Hip External Rotation  4+/5    Left Hip Internal Rotation  4+/5    Left Hip ABduction  5/5      Flexibility   Soft Tissue Assessment  /Muscle Length  yes    Quadriceps  Lt knee 93 degrees, Rt knee 97 deg        [facility] Adult PT Treatment/Exercise - 07/04/19 0001      Self-Care   Self-Care  Scar Mobilizations    Scar Mobilizations  pt instructed in self scar mobilization ; pt verbalized understanding when shown demonstration.       Knee/Hip Exercises: Stretches   Passive Hamstring Stretch  Left;Right;2 reps;20 seconds    Quad Stretch  Left;2 reps;30 seconds    Hip Flexor Stretch  Left;2 reps;20 seconds   standing, with leg extended back leg, toes on ground.      Knee/Hip Exercises: Aerobic   Recumbent Bike  L1: 5 min       Knee/Hip Exercises: Standing   Hip Abduction  Stengthening;Right;Left;1 set;10 reps;Knee straight    Hip Extension  Stengthening;Right;Left;1 set;10 reps    Step Down  Right;1 set;5 reps   slow eccentric lowering; retro step up.  Knee/Hip Exercises: Seated   Sit to Sand  10 reps;without UE support      Knee/Hip Exercises: Supine   Bridges  Strengthening;1 set;10 reps    Straight Leg Raises  Left;1 set;10 reps      Knee/Hip Exercises: Sidelying   Hip ABduction  Left;1 set;10 reps      Knee/Hip Exercises: Prone   Hip Extension  Strengthening;Left;Right;1 set;10 reps   limited clearance; substitution from low back     Manual Therapy   Manual Therapy  Soft tissue mobilization;Taping    Soft tissue mobilization  IASTM and STM to Lt mid-prox lateral quad to decrease fascial restrictions; pt did not tolerate IASTM well, very point tender over greater trochanter area    [place]  I strip of reg Rock tape applied over Lt hip incision in a zigzag pattern for scar management;  I strip of sensitive skin Rock tape applied to Lt lateral hip near greater trochanter with 25% stretch to decompress tissue, desensitize, and decrease pain.               PT Education - 07/04/19 1011    Education Details  updated HEP, info on kinesiology tape.     Person(s) Educated  Patient    Methods  Explanation;Handout;Verbal cues;Demonstration    Comprehension  Verbalized understanding;Returned demonstration          PT Long Term Goals - 07/04/19 1355      PT LONG TERM GOAL #1   Title  Pt will be independent in his HEP and progression.    Baseline  issued new HEP on 06/20/2019    Time  6    Period  Weeks    Status  On-going      PT LONG TERM GOAL #2   Title  Pt will improve his FOTO score from 49% limitation to </=29 % limitation.    Time  6    Period  Weeks    Status  On-going      PT LONG TERM GOAL #3   Title  Pt will improve his L SLS to >/= 10 seconds in order to improve functional mobility.    Baseline  3 seconds    Time  6    Period  Weeks    Status  On-going      PT LONG TERM GOAL #4   Title  Pt will be able to amb 1 mile with no pain reported with normalized gait pattern.    Baseline  antalgic gait walking 1 block 3 times each day.    Time  6    Period  Weeks    Status  On-going      PT LONG TERM GOAL #5   Title  Pt will improve his L LE hip strength to >/= 4+/5 in order to improve gait and funtional mobility.    Baseline  see flowsheets    Time  6    Status  Partially Met            Plan - 07/04/19 1351    Clinical Impression Statement  Pt demonstrates improved Lt hip strength.  Pt tolerated all exercises well, with mild increase in discomfort with stretches and fatigue during strengthening. Pt anxious to return to golf, however encouraged to hold off on golf until cleared by surgeon.  Pt has partially met LTG#4 and is making good progress towards remaining goals.  Personal Factors and Comorbidities  Comorbidity 2    Comorbidities  h/o chronic LBP, arthritis, COPD, s/p L THR on 06/03/2019    Examination-Participation Restrictions  Other;Community Activity    Stability/Clinical Decision Making  Stable/Uncomplicated    Rehab Potential  Excellent    PT Frequency  1x / week    PT Duration  6 weeks    PT  Treatment/Interventions  ADLs/Self Care Home Management;Cryotherapy;Electrical Stimulation;Moist Heat;Ultrasound;Gait training;Stair training;Functional mobility training;Neuromuscular re-education;Balance training;Therapeutic exercise;Therapeutic activities;Cognitive remediation;Patient/family education;Manual techniques;Taping;Dry needling    PT Next Visit Plan  add SLS/ balance exercises to HEP.    PT Home Exercise Plan  Access Code: [email or web address]    Consulted and Agree with Plan of Care  Patient       Patient will benefit from skilled therapeutic intervention in order to improve the following deficits and impairments:  Pain, Decreased strength, Decreased range of motion, Difficulty walking, Abnormal gait, Decreased mobility, Impaired flexibility  Visit Diagnosis: Pain in left hip  Muscle weakness (generalized)  Difficulty in walking, not elsewhere classified     Problem List There are no problems to display for this patient.  Kerin Perna, PTA 07/04/19 2:01 PM' [facility] [place], [place], [address] Phone: [phone]   Fax:  [phone]  Name: Johnny Arnold MRN: [id number] Date of Birth: 12-Jun-1955

## 2019-07-04 NOTE — Patient Instructions (Addendum)
Kinesiology tape What is kinesiology tape?  There are many brands of kinesiology tape.  KTape, [employer], [employer], Dynamic tape, to name a few. It is an elasticized tape designed to support the body's natural healing process. This tape provides stability and support to muscles and joints without restricting motion. It can also help decrease swelling in the area of application. How does it work? The tape microscopically lifts and decompresses the skin to allow for drainage of lymph (swelling) to flow away from area, reducing inflammation.  The tape has the ability to help re-educate the neuromuscular system by targeting specific receptors in the skin.  The presence of the tape increases the body's awareness of posture and body mechanics.  Do not use with: . Open wounds . Skin lesions . Adhesive allergies Safe removal of the tape: In some rare cases, mild/moderate skin irritation can occur.  This can include redness, itchiness, or hives. If this occurs, immediately remove tape and consult your primary care physician if symptoms are severe or do not resolve within 2 days.  To remove tape safely, hold nearby skin with one hand and gentle roll tape down with other hand.  You can apply oil or conditioner to tape while in shower prior to removal to loosen adhesive.  DO NOT swiftly rip tape off like a band-aid, as this could cause skin tears and additional skin irritation.   Access Code: [email or web address]  URL: [email or web address]  Date: 07/04/2019  Prepared by: Mayer Camel   Exercises  Supine Bridge - 15 reps - 1 sets - 1x daily - 7x weekly  Supine Active Straight Leg Raise - 10 reps - 1 sets - 1x daily - 7x weekly  Sidelying Hip Abduction - 10 reps - 2 sets - 1x daily - 7x weekly  Standing Hip Extension - 10 reps - 2 sets - 1x daily - 7x weekly  Upright Side Lunge - 10 reps - 1 sets - 1x daily - 7x weekly  Hooklying Hamstring Stretch with Strap - 3 reps - 30 seconds hold - 2x  daily - 7x weekly  Prone Quadriceps Stretch with Strap - 3 reps - 30 second hold - 2x daily - 7x weekly  Gastroc Stretch on Step - 2-3 reps - 1 sets - 30 hold - 2x daily - 7x weekly

## 2019-07-11 ENCOUNTER — Other Ambulatory Visit: Payer: Self-pay

## 2019-07-11 ENCOUNTER — Encounter: Payer: Self-pay | Admitting: Physical Therapy

## 2019-07-11 ENCOUNTER — Ambulatory Visit (INDEPENDENT_AMBULATORY_CARE_PROVIDER_SITE_OTHER): Payer: Medicare Other | Admitting: Physical Therapy

## 2019-07-11 DIAGNOSIS — M6281 Muscle weakness (generalized): Secondary | ICD-10-CM

## 2019-07-11 DIAGNOSIS — R262 Difficulty in walking, not elsewhere classified: Secondary | ICD-10-CM | POA: Diagnosis not present

## 2019-07-11 DIAGNOSIS — M25552 Pain in left hip: Secondary | ICD-10-CM | POA: Diagnosis not present

## 2019-07-11 NOTE — Therapy (Addendum)
[place], [place], [address] Phone: [phone]   Fax:  [phone]  Physical Therapy Treatment/Discharge  Patient Details  Name: Johnny Arnold MRN: [id number] Date of Birth: 06/23/1955 Referring Provider (PT): Hinda Lenis, MD   Encounter Date: 07/11/2019  PT End of Session - 07/11/19 0814    Visit Number  4    Number of Visits  7    Date for PT Re-Evaluation  08/15/19    PT Start Time  0815    PT Stop Time  0842    PT Time Calculation (min)  27 min    Activity Tolerance  Patient tolerated treatment well    Behavior During Therapy  [facility] for tasks assessed/performed       History reviewed. No pertinent past medical history.  Past Surgical History:  Procedure Laterality Date  . left total hip arthroplasty Left 06/03/2019    There were no vitals filed for this visit.  Subjective Assessment - 07/11/19 0815    Subjective  Pt saw his MD and he told him he was doing very well and doesnt' think he needs PT any more.  He is happy with progress - only concern is the scar tissue on side of hip and wants to make sure he does the right things for that.    Pertinent History  h/o LBP without sciatica, s/p L hip replacement on 06/03/2019.    Patient Stated Goals  Play golf    Currently in Pain?  Yes    Pain Score  2     Pain Location  Hip    Pain Orientation  Left    Pain Descriptors / Indicators  Dull    Pain Type  Surgical pain         [facility] PT Assessment - 07/11/19 0001      Assessment   Medical Diagnosis  Left total hip replacement on 06/03/2019    Referring Provider (PT)  Hinda Lenis, MD      Observation/Other Assessments   Focus on Therapeutic Outcomes (FOTO)   25% limited      Functional Tests   Functional tests  Single leg stance      Single Leg Stance   Comments  Lt 10 sec,  Rt > 10 sec      Strength   Right Hip Extension  4/5    Left Hip Extension  5/5    Left Hip  External Rotation  4+/5    Left Hip ABduction  5/5                   [facility] Adult PT Treatment/Exercise - 07/11/19 0001      Knee/Hip Exercises: Stretches   Hip Flexor Stretch  Left;2 reps;30 seconds   seated   Gastroc Stretch  Both;2 reps;30 seconds      Knee/Hip Exercises: Standing   SLS  30 reps each side, leg swings, then diver to mat       Knee/Hip Exercises: Supine   Single Leg Bridge  Strengthening;Both;20 reps   figure 4            PT Education - 07/11/19 0825    Education Details  HEP progression and cross friction massage for scar tissue    Person(s) Educated  Patient    Methods  Explanation;Demonstration;Handout    Comprehension  Returned demonstration;Verbalized understanding          PT Long Term Goals - 07/11/19 0964  PT LONG TERM GOAL #1   Title  Pt will be independent in his HEP and progression.    Status  Achieved      PT LONG TERM GOAL #2   Title  Pt will improve his FOTO score from 49% limitation to </=29 % limitation.    Baseline  25% limited    Status  Achieved      PT LONG TERM GOAL #3   Title  Pt will improve his L SLS to >/= 10 seconds in order to improve functional mobility.    Status  Achieved      PT LONG TERM GOAL #4   Title  Pt will be able to amb 1 mile with no pain reported with normalized gait pattern.    Baseline  able to walk however had some pain    Status  Partially Met      PT LONG TERM GOAL #5   Title  Pt will improve his L LE hip strength to >/= 4+/5 in order to improve gait and funtional mobility.    Status  Achieved            Plan - 07/11/19 0240    Clinical Impression Statement  Pt is pleased with his progress.  His only concern is breaking up the scar tissue in the lateral Lt hip.  He is independent with this and with his HEP.  He has met all but one goal, that is the walking without pain.  He does report some pain with longer walks.  He requests to be placed on hold, will continue to work  on his HEP and manual work to lateral hip.  He will also try some golf swings.  If he has any difficulty or increased pain he will call and return for a couple more visits    PT Treatment/Interventions  ADLs/Self Care Home Management;Cryotherapy;Electrical Stimulation;Moist Heat;Ultrasound;Gait training;Stair training;Functional mobility training;Neuromuscular re-education;Balance training;Therapeutic exercise;Therapeutic activities;Cognitive remediation;Patient/family education;Manual techniques;Taping;Dry needling    PT Next Visit Plan  place on hold per pt request pleased with progress    Consulted and Agree with Plan of Care  Patient       Patient will benefit from skilled therapeutic intervention in order to improve the following deficits and impairments:  Pain, Decreased strength, Decreased range of motion, Difficulty walking, Abnormal gait, Decreased mobility, Impaired flexibility  Visit Diagnosis: Pain in left hip  Muscle weakness (generalized)  Difficulty in walking, not elsewhere classified     Problem List There are no problems to display for this patient.   Jeral Pinch PT  07/11/2019, 8:41 AM  [facility] [place], [place], [address] Phone: [phone]   Fax:  [phone]  Name: Johnny Arnold MRN: [id number] Date of Birth: Oct 16, 1955   PHYSICAL THERAPY DISCHARGE SUMMARY  Visits from Start of Care: 4  Current functional level related to goals / functional outcomes: See above - pt very happy with progress and functional abilities   Remaining deficits: Some tightness in the hip    Education / Equipment: HEP Plan: Patient agrees to discharge.  Patient goals were partially met. Patient is being discharged due to being pleased with the current functional level.  ?????Pt was placed on hold and if he had problems was to call and be scheduled.  No calls received so he is being discharged       Jeral Pinch, PT 08/27/19 10:27 AM

## 2019-07-18 ENCOUNTER — Encounter: Payer: Medicare Other | Admitting: Physical Therapy

## 2019-07-25 ENCOUNTER — Encounter: Payer: Medicare Other | Admitting: Physical Therapy

## 2020-12-16 ENCOUNTER — Other Ambulatory Visit: Payer: Self-pay | Admitting: Unknown Physician Specialty

## 2020-12-16 ENCOUNTER — Ambulatory Visit (INDEPENDENT_AMBULATORY_CARE_PROVIDER_SITE_OTHER): Payer: Medicare Other

## 2020-12-16 ENCOUNTER — Other Ambulatory Visit: Payer: Self-pay

## 2020-12-16 DIAGNOSIS — R140 Abdominal distension (gaseous): Secondary | ICD-10-CM

## 2020-12-16 DIAGNOSIS — R110 Nausea: Secondary | ICD-10-CM | POA: Diagnosis not present

## 2020-12-16 DIAGNOSIS — R197 Diarrhea, unspecified: Secondary | ICD-10-CM | POA: Diagnosis not present

## 2022-08-29 ENCOUNTER — Encounter: Payer: Self-pay | Admitting: Rehabilitative and Restorative Service Providers"

## 2022-08-29 ENCOUNTER — Ambulatory Visit: Payer: Medicare Other | Attending: Physician Assistant | Admitting: Rehabilitative and Restorative Service Providers"

## 2022-08-29 ENCOUNTER — Other Ambulatory Visit: Payer: Self-pay

## 2022-08-29 DIAGNOSIS — M25611 Stiffness of right shoulder, not elsewhere classified: Secondary | ICD-10-CM | POA: Diagnosis present

## 2022-08-29 DIAGNOSIS — M6281 Muscle weakness (generalized): Secondary | ICD-10-CM

## 2022-08-29 DIAGNOSIS — R29898 Other symptoms and signs involving the musculoskeletal system: Secondary | ICD-10-CM

## 2022-08-29 DIAGNOSIS — M25511 Pain in right shoulder: Secondary | ICD-10-CM | POA: Diagnosis present

## 2022-08-29 DIAGNOSIS — R293 Abnormal posture: Secondary | ICD-10-CM

## 2022-08-29 NOTE — Therapy (Signed)
OUTPATIENT PHYSICAL THERAPY SHOULDER EVALUATION   Patient Name: Johnny Arnold MRN: [id number] DOB:[birth year], 67 y.o., male Today's Date: 08/29/2022  END OF SESSION:   History reviewed. No pertinent past medical history. Past Surgical History:  Procedure Laterality Date   left total hip arthroplasty Left 06/03/2019   There are no problems to display for this patient.   PCP: Dr Finis Bud  REFERRING PROVIDER: Malena Peer, PA-C; Dr Norwood Levo  REFERRING DIAG: Rt RCR   THERAPY DIAG:  No diagnosis found.  Rationale for Evaluation and Treatment: Rehabilitation  ONSET DATE: 08/22/22  SUBJECTIVE:                                                                                                                                                                                      SUBJECTIVE STATEMENT: Patient reports that he fell off a ladder ~ 7 weeks ago with injury to Rt shoulder. Underwent Rt RCR arthroscopically 08/22/22 with some continued bleeding following surgery. No longer bleeding (stopped Saturday 08/26/22). In abduction sling except for dressing.  Hand dominance: Right  PERTINENT HISTORY: Rt RCR ~ 8 years ago; Lt THA 5 yrs ago; lumbar surgery 15 yrs ago; history of chronic LBP; asthma; arthritis; sleep apnea  PAIN:  Are you having pain? Yes: NPRS scale: 3/10 Pain location: shoulder and biceps Pain description: dull aching  Aggravating factors: moving and getting in and out of the sling  Relieving factors: meds; ice  PRECAUTIONS: Shoulder- post op protocol  WEIGHT BEARING RESTRICTIONS: Yes no wt bearing Rt UE   FALLS:  Has patient fallen in last 6 months? Yes. 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   UPPER EXTREMITY ROM:   Active/Passive ROM Right Eval Passive  Sitting  Left Eval AROM Standing   Shoulder flexion 75 deg 136  Shoulder extension  45  Shoulder abduction  140  Shoulder adduction    Shoulder internal rotation  Thumb &9 pain  Shoulder external rotation  90  shoulder at 90 deg, elbow 90 deg   Elbow flexion    Elbow extension    Wrist flexion    Wrist extension    Wrist ulnar deviation    Wrist radial deviation    Wrist pronation    Wrist supination    (Blank rows = not tested) Cervical ROM - tight end ranges greatest with Lr lateral flexion and Lt rotation   UPPER EXTREMITY MMT:  MMT Right eval Left eval  Shoulder flexion    Shoulder extension    Shoulder abduction    Shoulder adduction    Shoulder internal rotation    Shoulder external rotation    Middle trapezius    Lower trapezius    Elbow flexion    Elbow extension    Wrist flexion    Wrist extension    Wrist ulnar deviation    Wrist radial deviation    Wrist pronation    Wrist supination    Grip strength (lbs)    (Blank rows = not tested)  PALPATION: muscular tightness Rt shoulder girdle     [facility] Adult PT Treatment:                                                DATE: 08/29/22 Therapeutic Exercise: Chin tuck 10 sec x 5 w/noodle  Scap squeeze 10 sec x 10 w/ noodle  Active elbow, wrist, hand ROM AROM forearm supination w/ gentle stretch end range 2-3 reps 5 sec hold  Manual Therapy:  Soft tissue mobilization Rt shoulder girdle  PROM Rt  shoulder flexion, scaption within tissue tolerance  Gentle joint movement GH joint  Neuromuscular re-ed: Postural correction encouraging patient to sit and stand with improved upright posture, posterior shoulder girdle engaged  Modalities: Vaso 34 deg low pressure x 10 min  Self Care: Use of noodle for exercises and for sitting in recliner    PATIENT EDUCATION: Education details: POC; HEP  Person educated: Patient Education method: Consulting civil engineer, Media planner, Corporate treasurer cues, Verbal cues, and Handouts Education comprehension: verbalized understanding, returned demonstration, verbal cues required, tactile cues required, and needs further education  HOME EXERCISE PROGRAM: Access Code: [email or web address] URL: [email or web address] Date: 08/29/2022 Prepared by: Gillermo Murdoch  Exercises - Circular Shoulder Pendulum with Table Support  - 3-4 x daily - 7 x weekly - 1 sets - 20-30 reps - Seated Cervical Retraction  - 3 x daily - 7 x weekly - 1 sets - 10 reps - Standing Scapular Retraction  - 3 x daily - 7 x weekly - 1 sets - 10 reps - 10 hold  ASSESSMENT:  CLINICAL IMPRESSION: Patient is a 67 y.o. male who was seen today for physical therapy evaluation and treatment s/p Rt RCR 08/22/22 following a fall ~ 6 weeks before with injury to Rt shoulder. He had a previous Rt RCR ~ 8 years ago. Patient presents with Rt UE in abduction sling. He has limited functional ability with Rt UE; decreased ROM, strength, function Rt UE; continued edema Rt shoulder; poor posture and alignment; decreased scapular stability/strength. Patient will benefit from PT to address problems identified.    OBJECTIVE IMPAIRMENTS: decreased activity tolerance, decreased mobility, decreased ROM, decreased strength, hypomobility, increased edema, increased fascial restrictions, impaired flexibility, impaired UE functional use, improper body mechanics, postural dysfunction, and pain.   ACTIVITY LIMITATIONS: carrying, lifting,  bending, sitting, sleeping, bed mobility, bathing,  toileting, dressing, self feeding, reach over head, and hygiene/grooming  PARTICIPATION LIMITATIONS: cleaning, driving, community activity, occupation, and yard work  PERSONAL FACTORS: Age, Fitness, Past/current experiences, Profession, Time since onset of injury/illness/exacerbation, and comorbidities: prior Rt shoulder RCR ~ 8 yrs ago; hypertension are also affecting patient's functional outcome.   REHAB POTENTIAL: Good  CLINICAL DECISION MAKING: Stable/uncomplicated  EVALUATION COMPLEXITY: Low   GOALS: Goals reviewed with patient? Yes  SHORT TERM GOALS: Target date: 10/10/2022  Increase AROM Rt shoulder to 90 deg elevation Baseline: Goal status: INITIAL  2.  Independent in initial HEP  Baseline:  Goal status: INITIAL  3.  Review and progress with post op precautions, progressing as protocol dictates  Baseline:  Goal status: INITIAL   LONG TERM GOALS: Target date: 11/21/2022  Improve posture and alignment with patient to demonstrate improved upright posture with posterior shoulder girdle engaged  Baseline:  Goal status: INITIAL  2.  Increase AROM Rt shoulder to WFL's in all planes  Baseline:  Goal status: INITIAL  3.  4/5 to 5/5 strength Rt shoulder  Baseline:  Goal status: INITIAL  4.  Patient reports ability to return to functional activities using Rt UE including dressing, grooming, yard work, ADL's  Baseline:  Goal status: INITIAL  5.  Independent HEP (including aquatic program as indicated) Baseline:  Goal status: INITIAL  6.  Improve functional limitation score to 54 Baseline: 4 Goal status: INITIAL  PLAN:  PT FREQUENCY: 2x/week  PT DURATION: 12 weeks  PLANNED INTERVENTIONS: Therapeutic exercises, Therapeutic activity, Neuromuscular re-education, Patient/Family education, Self Care, Joint mobilization, Aquatic Therapy, Dry Needling, Electrical stimulation, Cryotherapy, Moist heat, Taping,  Vasopneumatic device, Ultrasound, Ionotophoresis 4mg /ml Dexamethasone, Manual therapy, and Re-evaluation  PLAN FOR NEXT SESSION: review and progress exercises per protocol; manual work, DN, modalities as indicated; progressing exercises as indicated per protocol    Jaz Mallick Nilda Simmer, PT 08/29/2022, 11:01 AM

## 2022-09-01 ENCOUNTER — Encounter: Payer: Self-pay | Admitting: Rehabilitative and Restorative Service Providers"

## 2022-09-01 ENCOUNTER — Ambulatory Visit: Payer: Medicare Other | Admitting: Rehabilitative and Restorative Service Providers"

## 2022-09-01 DIAGNOSIS — M25511 Pain in right shoulder: Secondary | ICD-10-CM

## 2022-09-01 DIAGNOSIS — M6281 Muscle weakness (generalized): Secondary | ICD-10-CM

## 2022-09-01 DIAGNOSIS — R29898 Other symptoms and signs involving the musculoskeletal system: Secondary | ICD-10-CM

## 2022-09-01 DIAGNOSIS — M25611 Stiffness of right shoulder, not elsewhere classified: Secondary | ICD-10-CM

## 2022-09-01 DIAGNOSIS — R293 Abnormal posture: Secondary | ICD-10-CM

## 2022-09-01 NOTE — Therapy (Signed)
OUTPATIENT PHYSICAL THERAPY SHOULDER TREATMENT   Patient Name: Johnny GulaRichard Arnold MRN: [id number] DOB:07/26/1955, 67 y.o., male Today's Date: 09/01/2022  END OF SESSION:  PT End of Session - 09/01/22 0903     Visit Number 2    Number of Visits 24    Date for PT Re-Evaluation 11/21/22    Authorization Type blue medicare $10 copay    Progress Note Due on Visit 10    PT Start Time [phone]    PT Stop Time 0930    PT Time Calculation (min) 39 min    Activity Tolerance Patient tolerated treatment well             History reviewed. No pertinent past medical history. Past Surgical History:  Procedure Laterality Date   left total hip arthroplasty Left 06/03/2019   There are no problems to display for this patient.   PCP: Dr Lasandra Beechathy Judge Swaney  REFERRING PROVIDER: Shon BatonSarah Anderson, PA-C; Dr Theophilus BonesStarman  REFERRING DIAG: Rt RCR   THERAPY DIAG:  Acute pain of right shoulder  Stiffness of right shoulder, not elsewhere classified  Muscle weakness (generalized)  Other symptoms and signs involving the musculoskeletal system  Abnormal posture  Rationale for Evaluation and Treatment: Rehabilitation  ONSET DATE: 08/22/22  SUBJECTIVE:                                                                                                                                                                                      SUBJECTIVE STATEMENT: 09/01/22: patient reports some pain in the shoulder and continued difficulty getting in a comfortable position to sleep. Sleeping in his recliner.   EVAL: Patient reports that he fell off a ladder ~ 7 weeks ago with injury to Rt shoulder. Underwent Rt RCR arthroscopically 08/22/22 with some continued bleeding following surgery. No longer bleeding (stopped Saturday 08/26/22). In abduction sling except for dressing.  Hand dominance: Right  PERTINENT HISTORY: Rt RCR ~ 8 years ago; Lt THA 5 yrs ago; lumbar surgery 15 yrs ago; history of chronic LBP; asthma;  arthritis; sleep apnea  PAIN:  Are you having pain? Yes: NPRS scale: 3-4/10 Pain location: shoulder and biceps Pain description: dull aching  Aggravating factors: moving and getting in and out of the sling  Relieving factors: meds; ice  PRECAUTIONS: Shoulder- post op protocol  WEIGHT BEARING RESTRICTIONS: Yes no wt bearing Rt UE   FALLS:  Has patient fallen in last 6 months? Yes. 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   UPPER EXTREMITY ROM:   Active/Passive ROM Right Eval Passive  Sitting  Left Eval AROM Standing   Shoulder flexion 75 deg 136  Shoulder extension  45  Shoulder abduction  140  Shoulder adduction    Shoulder internal rotation  Thumb &9 pain  Shoulder external rotation  90 shoulder at 90 deg, elbow 90 deg   Elbow flexion    Elbow extension    Wrist flexion    Wrist extension    Wrist ulnar deviation    Wrist radial deviation    Wrist pronation    Wrist supination    (Blank rows = not tested) Cervical ROM - tight end ranges greatest with Lr lateral flexion and Lt rotation   UPPER EXTREMITY MMT:  MMT Right eval Left eval  Shoulder flexion    Shoulder extension    Shoulder abduction    Shoulder adduction    Shoulder internal rotation    Shoulder external rotation    Middle trapezius    Lower trapezius    Elbow flexion    Elbow extension    Wrist flexion    Wrist extension    Wrist ulnar deviation    Wrist radial deviation    Wrist  pronation    Wrist supination    Grip strength (lbs)    (Blank rows = not tested)  PALPATION: muscular tightness Rt shoulder girdle     [facility] Adult PT Treatment:                                                 DATE: 09/01/22 Therapeutic Exercise: Chin tuck 10 sec x 5 w/noodle  Scap squeeze 10 sec x 10 w/ noodle  Active elbow, wrist, hand ROM AROM forearm supination w/ gentle stretch end range 2-3 reps 5 sec hold  Manual Therapy:  Soft tissue mobilization Rt shoulder girdle  PROM Rt shoulder flexion, scaption within tissue tolerance - pt supine  Gentle joint movement GH joint  Neuromuscular re-ed: Postural correction encouraging patient to sit and stand with improved upright posture, posterior shoulder girdle engaged  Modalities: To ice at home  Self Care: Use of noodle for exercises and for sitting in recliner  Encouraged patient to try lying down on his bed a couple times/day  DATE: 08/29/22 Therapeutic Exercise: Chin tuck 10 sec x 5 w/noodle  Scap squeeze 10 sec x 10 w/ noodle  Active elbow, wrist, hand ROM AROM forearm supination w/ gentle stretch end range 2-3 reps 5 sec hold  Manual Therapy:  Soft tissue mobilization Rt shoulder girdle  PROM Rt shoulder flexion, scaption within tissue tolerance  Gentle joint movement GH joint  Neuromuscular re-ed: Postural correction encouraging patient to sit and stand with improved upright posture, posterior shoulder girdle engaged  Modalities: Vaso 34 deg low pressure x 10 min  Self Care: Use of noodle for exercises and for sitting in recliner    PATIENT EDUCATION: Education details: POC; HEP  Person educated: Patient Education method: Programmer, multimedia, Facilities manager, Actor cues, Verbal cues, and Handouts Education comprehension:  verbalized understanding, returned demonstration, verbal cues required, tactile cues required, and needs further education  HOME EXERCISE PROGRAM: Access Code: [email or web address] URL:  [email or web address] Date: 08/29/2022 Prepared by: Corlis Leakelyn Collyn Selk  Exercises - Circular Shoulder Pendulum with Table Support  - 3-4 x daily - 7 x weekly - 1 sets - 20-30 reps - Seated Cervical Retraction  - 3 x daily - 7 x weekly - 1 sets - 10 reps - Standing Scapular Retraction  - 3 x daily - 7 x weekly - 1 sets - 10 reps - 10 hold  ASSESSMENT:  CLINICAL IMPRESSION: Patient reports compliance with HEP. Good tolerance of manual work and PROM in supine.   OBJECTIVE IMPAIRMENTS: s/p Rt RCR 08/22/22 following a fall ~ 6 weeks before with injury to Rt shoulder. He had a previous Rt RCR ~ 8 years ago. Patient presents with Rt UE in abduction sling. He has limited functional ability with Rt UE; decreased ROM, strength, function Rt UE; continued edema Rt shoulder; poor posture and alignment; decreased scapular stability/strength.decreased activity tolerance, decreased mobility, decreased ROM, decreased strength, hypomobility, increased edema, increased fascial restrictions, impaired flexibility, impaired UE functional use, improper body mechanics, postural dysfunction, and pain.    GOALS: Goals reviewed with patient? Yes  SHORT TERM GOALS: Target date: 10/10/2022  Increase AROM Rt shoulder to 90 deg elevation Baseline: Goal status: INITIAL  2.  Independent in initial HEP  Baseline:  Goal status: INITIAL  3.  Review and progress with post op precautions, progressing as protocol dictates  Baseline:  Goal status: INITIAL   LONG TERM GOALS: Target date: 11/21/2022  Improve posture and alignment with patient to demonstrate improved upright posture with posterior shoulder girdle engaged  Baseline:  Goal status: INITIAL  2.  Increase AROM Rt shoulder to WFL's in all planes  Baseline:  Goal status: INITIAL  3.  4/5 to 5/5 strength Rt shoulder  Baseline:  Goal status: INITIAL  4.  Patient reports ability to return to functional activities using Rt UE including dressing,  grooming, yard work, ADL's  Baseline:  Goal status: INITIAL  5.  Independent HEP (including aquatic program as indicated) Baseline:  Goal status: INITIAL  6.  Improve functional limitation score to 54 Baseline: 4 Goal status: INITIAL  PLAN:  PT FREQUENCY: 2x/week  PT DURATION: 12 weeks  PLANNED INTERVENTIONS: Therapeutic exercises, Therapeutic activity, Neuromuscular re-education, Patient/Family education, Self Care, Joint mobilization, Aquatic Therapy, Dry Needling, Electrical stimulation, Cryotherapy, Moist heat, Taping, Vasopneumatic device, Ultrasound, Ionotophoresis 4mg /ml Dexamethasone, Manual therapy, and Re-evaluation  PLAN FOR NEXT SESSION: review and progress exercises per protocol; manual work, DN, modalities as indicated; progressing exercises as indicated per protocol    Val Rileselyn P Kayshawn Ozburn, PT 09/01/2022, 9:05 AM

## 2022-09-05 ENCOUNTER — Encounter: Payer: Self-pay | Admitting: Rehabilitative and Restorative Service Providers"

## 2022-09-05 ENCOUNTER — Ambulatory Visit: Payer: Medicare Other | Admitting: Rehabilitative and Restorative Service Providers"

## 2022-09-05 DIAGNOSIS — M25611 Stiffness of right shoulder, not elsewhere classified: Secondary | ICD-10-CM

## 2022-09-05 DIAGNOSIS — R293 Abnormal posture: Secondary | ICD-10-CM

## 2022-09-05 DIAGNOSIS — M6281 Muscle weakness (generalized): Secondary | ICD-10-CM

## 2022-09-05 DIAGNOSIS — M25511 Pain in right shoulder: Secondary | ICD-10-CM | POA: Diagnosis not present

## 2022-09-05 DIAGNOSIS — R29898 Other symptoms and signs involving the musculoskeletal system: Secondary | ICD-10-CM

## 2022-09-05 NOTE — Therapy (Signed)
OUTPATIENT PHYSICAL THERAPY SHOULDER TREATMENT   Patient Name: Johnny GulaRichard Raymond MRN: [id number] DOB:1955-07-02, 67 y.o., male Today's Date: 09/05/2022  END OF SESSION:  PT End of Session - 09/05/22 0928     Visit Number 3    Number of Visits 24    Date for PT Re-Evaluation 11/21/22    Authorization Type blue medicare $10 copay    Progress Note Due on Visit 10    PT Start Time 0928    PT Stop Time 1015    PT Time Calculation (min) 47 min    Activity Tolerance Patient tolerated treatment well             History reviewed. No pertinent past medical history. Past Surgical History:  Procedure Laterality Date   left total hip arthroplasty Left 06/03/2019   There are no problems to display for this patient.   PCP: Dr Lasandra Beechathy Judge Swaney  REFERRING PROVIDER: Shon BatonSarah Anderson, PA-C; Dr Theophilus BonesStarman  REFERRING DIAG: Rt RCR   THERAPY DIAG:  Acute pain of right shoulder  Stiffness of right shoulder, not elsewhere classified  Muscle weakness (generalized)  Other symptoms and signs involving the musculoskeletal system  Abnormal posture  Rationale for Evaluation and Treatment: Rehabilitation  ONSET DATE: 08/22/22  SUBJECTIVE:                                                                                                                                                                                      SUBJECTIVE STATEMENT: 09/05/22: Patient reports some pain in the shoulder and continued difficulty getting in a comfortable position to sleep. Sleeping in his recliner mostly but he is trying to sleep in the bed an hour or so.   EVAL: Patient reports that he fell off a ladder ~ 7 weeks ago with injury to Rt shoulder. Underwent Rt RCR arthroscopically 08/22/22 with some continued bleeding following surgery. No longer bleeding (stopped Saturday 08/26/22). In abduction sling except for dressing.  Hand dominance: Right  PERTINENT HISTORY: Rt RCR ~ 8 years ago; Lt THA 5 yrs ago; lumbar  surgery 15 yrs ago; history of chronic LBP; asthma; arthritis; sleep apnea  PAIN:  Are you having pain? Yes: NPRS scale: 3-4/10 Pain location: shoulder and biceps Pain description: dull aching  Aggravating factors: moving and getting in and out of the sling  Relieving factors: meds; ice  PRECAUTIONS: Shoulder- post op protocol  WEIGHT BEARING RESTRICTIONS: Yes no wt bearing Rt UE   FALLS:  Has patient fallen in last 6 months? Yes. 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   UPPER EXTREMITY ROM:   Active/Passive ROM Right Eval Passive  Sitting  Left Eval AROM Standing   Shoulder flexion 75 deg 136  Shoulder extension  45  Shoulder abduction  140  Shoulder adduction    Shoulder internal rotation  Thumb &9 pain  Shoulder external rotation  90 shoulder at 90 deg, elbow 90 deg   Elbow flexion    Elbow extension    Wrist flexion    Wrist extension    Wrist ulnar deviation    Wrist radial deviation    Wrist pronation    Wrist supination    (Blank rows = not tested) Cervical ROM - tight end ranges greatest with Lt lateral flexion and Lt rotation   UPPER EXTREMITY MMT:  MMT Right eval Left eval  Shoulder flexion    Shoulder extension    Shoulder abduction    Shoulder adduction    Shoulder internal rotation    Shoulder external rotation    Middle trapezius    Lower trapezius    Elbow flexion    Elbow extension    Wrist flexion    Wrist extension    Wrist ulnar  deviation    Wrist radial deviation    Wrist pronation    Wrist supination    Grip strength (lbs)    (Blank rows = not tested)  PALPATION: muscular tightness Rt shoulder girdle     [facility] Adult PT Treatment:                                                 DATE: 09/05/22 Therapeutic Exercise: Chin tuck 10 sec x 5 w/noodle  Scap squeeze 10 sec x 10 w/ noodle  Active elbow, wrist, hand ROM AROM forearm supination w/ gentle stretch end range 2-3 reps 5 sec hold  Cervical rotation 3 sec x 5 Cervical lateral flexion 3 sec x 5  Manual Therapy:  Soft tissue mobilization Rt shoulder girdle  PROM Rt shoulder flexion, scaption within tissue tolerance - pt supine  Gentle joint movement GH joint  Neuromuscular re-ed: Postural correction encouraging patient to sit and stand with improved upright posture, posterior shoulder girdle engaged  Modalities: To ice at home  Self Care: Myofacial ball release Rt shoulder girdle anterior/posterior Use of noodle for exercises and for sitting in recliner  Encouraged patient to try lying down on his bed a couple times/day  DATE: 09/01/22 Therapeutic Exercise: Chin tuck 10 sec x 5 w/noodle  Scap squeeze 10 sec x 10 w/ noodle  Active elbow, wrist, hand ROM AROM forearm supination w/ gentle stretch end range 2-3 reps 5 sec hold  Manual Therapy:  Soft tissue mobilization Rt shoulder girdle  PROM Rt shoulder flexion, scaption within tissue tolerance - pt supine  Gentle joint movement GH joint  Neuromuscular re-ed: Postural correction encouraging patient to sit and stand with improved upright posture, posterior shoulder girdle engaged  Modalities: To ice at home  Self  Care: Use of noodle for exercises and for sitting in recliner  Encouraged patient to try lying down on his bed a couple times/day   PATIENT EDUCATION: Education details: POC; HEP  Person educated: Patient Education method: Explanation, Demonstration, Tactile cues, Verbal cues, and  Handouts Education comprehension: verbalized understanding, returned demonstration, verbal cues required, tactile cues required, and needs further education  HOME EXERCISE PROGRAM: Access Code: [email or web address] URL: [email or web address] Date: 09/05/2022 Prepared by: Corlis Leak  Exercises - Circular Shoulder Pendulum with Table Support  - 3-4 x daily - 7 x weekly - 1 sets - 20-30 reps - Seated Cervical Retraction  - 3 x daily - 7 x weekly - 1 sets - 10 reps - Standing Scapular Retraction  - 3 x daily - 7 x weekly - 1 sets - 10 reps - 10 hold - Standing Infraspinatus/Teres Minor Release with Ball at Wall  - 2 x daily - 7 x weekly - Standing Pectoral Release with Shoulder Abduction with Ball at Wall  - 2-3 x daily - 7 x weekly - Seated Cervical Rotation AROM  - 2 x daily - 7 x weekly - 1 sets - 5 reps - 2-3 sec  hold - Seated Cervical Sidebending AROM  - 2 x daily - 7 x weekly - 1 sets - 5 reps - 5-10 sec  hold  ASSESSMENT:  CLINICAL IMPRESSION: Patient continues to be compliant with HEP and post op precautions which were reviewed. Good tolerance of manual work and PROM in supine. No additional exercises allowed per protocol.  OBJECTIVE IMPAIRMENTS: s/p Rt RCR 08/22/22 following a fall ~ 6 weeks before with injury to Rt shoulder. He had a previous Rt RCR ~ 8 years ago. Patient presents with Rt UE in abduction sling. He has limited functional ability with Rt UE; decreased ROM, strength, function Rt UE; continued edema Rt shoulder; poor posture and alignment; decreased scapular stability/strength.decreased activity tolerance, decreased mobility, decreased ROM, decreased strength, hypomobility, increased edema, increased fascial restrictions, impaired flexibility, impaired UE functional use, improper body mechanics, postural dysfunction, and pain.    GOALS: Goals reviewed with patient? Yes  SHORT TERM GOALS: Target date: 10/10/2022  Increase AROM Rt shoulder to 90 deg  elevation Baseline: Goal status: INITIAL  2.  Independent in initial HEP  Baseline:  Goal status: INITIAL  3.  Review and progress with post op precautions, progressing as protocol dictates  Baseline:  Goal status: INITIAL   LONG TERM GOALS: Target date: 11/21/2022  Improve posture and alignment with patient to demonstrate improved upright posture with posterior shoulder girdle engaged  Baseline:  Goal status: INITIAL  2.  Increase AROM Rt shoulder to WFL's in all planes  Baseline:  Goal status: INITIAL  3.  4/5 to 5/5 strength Rt shoulder  Baseline:  Goal status: INITIAL  4.  Patient reports ability to return to functional activities using Rt UE including dressing, grooming, yard work, ADL's  Baseline:  Goal status: INITIAL  5.  Independent HEP (including aquatic program as indicated) Baseline:  Goal status: INITIAL  6.  Improve functional limitation score to 54 Baseline: 4 Goal status: INITIAL  PLAN:  PT FREQUENCY: 2x/week  PT DURATION: 12 weeks  PLANNED INTERVENTIONS: Therapeutic exercises, Therapeutic activity, Neuromuscular re-education, Patient/Family education, Self Care, Joint mobilization, Aquatic Therapy, Dry Needling, Electrical stimulation, Cryotherapy, Moist heat, Taping, Vasopneumatic device, Ultrasound, Ionotophoresis 4mg /ml Dexamethasone, Manual therapy, and Re-evaluation  PLAN FOR NEXT SESSION: review and progress exercises per protocol; manual work, DN, modalities as indicated; progressing  exercises as indicated per protocol    Bernadean Saling Rober Minion, PT 09/05/2022, 9:29 AM

## 2022-09-07 ENCOUNTER — Encounter: Payer: Self-pay | Admitting: Rehabilitative and Restorative Service Providers"

## 2022-09-07 ENCOUNTER — Ambulatory Visit: Payer: Medicare Other | Admitting: Rehabilitative and Restorative Service Providers"

## 2022-09-07 DIAGNOSIS — M25511 Pain in right shoulder: Secondary | ICD-10-CM | POA: Diagnosis not present

## 2022-09-07 DIAGNOSIS — M6281 Muscle weakness (generalized): Secondary | ICD-10-CM

## 2022-09-07 DIAGNOSIS — R293 Abnormal posture: Secondary | ICD-10-CM

## 2022-09-07 DIAGNOSIS — M25611 Stiffness of right shoulder, not elsewhere classified: Secondary | ICD-10-CM

## 2022-09-07 DIAGNOSIS — R29898 Other symptoms and signs involving the musculoskeletal system: Secondary | ICD-10-CM

## 2022-09-07 NOTE — Therapy (Signed)
OUTPATIENT PHYSICAL THERAPY SHOULDER TREATMENT   Patient Name: Johnny GulaRichard Mumford MRN: [id number] DOB:04-03-1956, 67 y.o., male Today's Date: 09/07/2022  END OF SESSION:  PT End of Session - 09/07/22 1023     Visit Number 4    Number of Visits 24    Date for PT Re-Evaluation 11/21/22    Authorization Type blue medic[employer] $10 copay    Progress Note Due on Visit 10    PT Start Time 1017    PT Stop Time 1100    PT Time Calculation (min) 43 min    Activity Tolerance Patient tolerated treatment well             History reviewed. No pertinent past medical history. Past Surgical History:  Procedure Laterality Date   left total hip arthroplasty Left 06/03/2019   There are no problems to display for this patient.   PCP: Dr Lasandra Beechathy Judge Swaney  REFERRING PROVIDER: Shon BatonSarah Anderson, PA-C; Dr Theophilus BonesStarman  REFERRING DIAG: Rt RCR   THERAPY DIAG:  Acute pain of right shoulder  Stiffness of right shoulder, not elsewhere classified  Muscle weakness (generalized)  Other symptoms and signs involving the musculoskeletal system  Abnormal posture  Rationale for Evaluation and Treatment: Rehabilitation  ONSET DATE: 08/22/22  SUBJECTIVE:                                                                                                                                                                                      SUBJECTIVE STATEMENT: Patient reports some pain in the shoulder all the time - low level. Patient continues with some difficulty getting in a comfortable position to sleep. Sleeping in his recliner mostly but he is trying to sleep in the bed an hour or so. 2.5 weeks out of surgery will be 3 weeks on 09/12/22  EVAL: Patient reports that he fell off a ladder ~ 7 weeks ago with injury to Rt shoulder. Underwent Rt RCR arthroscopically 08/22/22 with some continued bleeding following surgery. No longer bleeding (stopped Saturday 08/26/22). In abduction sling except for dressing.  Hand  dominance: Right  PERTINENT HISTORY: Rt RCR ~ 8 years ago; Lt THA 5 yrs ago; lumbar surgery 15 yrs ago; history of chronic LBP; asthma; arthritis; sleep apnea  PAIN:  Are you having pain? Yes: NPRS scale: 2/10 Pain location: shoulder and biceps Pain description: dull aching  Aggravating factors: moving and getting in and out of the sling  Relieving factors: meds; ice  PRECAUTIONS: Shoulder- post op protocol  WEIGHT BEARING RESTRICTIONS: Yes no wt bearing Rt UE   FALLS:  Has patient fallen in last 6 months? Yes. 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   UPPER EXTREMITY ROM:   Active/Passive ROM Right Eval Passive  Sitting  Left Eval AROM Standing   Shoulder flexion 75 deg 136  Shoulder extension  45  Shoulder abduction  140  Shoulder adduction    Shoulder internal rotation  Thumb &9 pain  Shoulder external rotation  90 shoulder at 90 deg, elbow 90 deg   Elbow flexion    Elbow extension    Wrist flexion    Wrist extension    Wrist ulnar deviation    Wrist radial deviation    Wrist pronation    Wrist supination    (Blank rows = not tested) Cervical ROM - tight end ranges greatest with Lt lateral flexion and Lt rotation   UPPER EXTREMITY MMT:  MMT Right eval Left eval  Shoulder flexion    Shoulder extension    Shoulder abduction    Shoulder adduction    Shoulder internal rotation    Shoulder external rotation    Middle trapezius    Lower trapezius    Elbow  flexion    Elbow extension    Wrist flexion    Wrist extension    Wrist ulnar deviation    Wrist radial deviation    Wrist pronation    Wrist supination    Grip strength (lbs)    (Blank rows = not tested)  PALPATION: muscular tightness Rt shoulder girdle     [facility] Adult PT Treatment:                                                  DATE: 09/07/22 Therapeutic Exercise: Chin tuck 10 sec x 5 w/noodle  Scap squeeze 10 sec x 10 w/ noodle  Active elbow, wrist, hand ROM AROM forearm supination w/ gentle stretch end range 2-3 reps 5 sec hold  Cervical rotation 3 sec x 5 Cervical lateral flexion 3 sec x 5  Lt sidelying scapular clock  Manual Therapy:  Soft tissue mobilization Rt shoulder girdle  PROM Rt shoulder flexion, scaption within tissue tolerance - pt supine  Gentle joint mobs Rt GH joint  Neuromuscular re-ed: Postural correction encouraging patient to sit and stand with improved upright posture, posterior shoulder girdle engaged  Modalities: To ice at home  Self Care: (HEP) Myofacial ball release Rt shoulder girdle anterior/posterior Use of noodle for exercises and for sitting in recliner  Encouraged patient to try lying down on his bed a couple times/day DATE: 09/05/22 Therapeutic Exercise: Chin tuck 10 sec x 5 w/noodle  Scap squeeze 10 sec x 10 w/ noodle  Active elbow, wrist, hand ROM AROM forearm supination w/ gentle stretch end range 2-3 reps 5 sec hold  Cervical rotation 3 sec x 5 Cervical lateral flexion 3 sec x 5  Manual Therapy:  Soft tissue mobilization Rt shoulder girdle  PROM Rt shoulder flexion, scaption within  tissue tolerance - pt supine  Gentle joint movement GH joint  Neuromuscular re-ed: Postural correction encouraging patient to sit and stand with improved upright posture, posterior shoulder girdle engaged  Modalities: To ice at home  Self Care: Myofacial ball release Rt shoulder girdle anterior/posterior Use of noodle for exercises and for sitting  in recliner  Encouraged patient to try lying down on his bed a couple times/day  DATE: 09/01/22 Therapeutic Exercise: Chin tuck 10 sec x 5 w/noodle  Scap squeeze 10 sec x 10 w/ noodle  Active elbow, wrist, hand ROM AROM forearm supination w/ gentle stretch end range 2-3 reps 5 sec hold  Manual Therapy:  Soft tissue mobilization Rt shoulder girdle  PROM Rt shoulder flexion, scaption within tissue tolerance - pt supine  Gentle joint movement GH joint  Neuromuscular re-ed: Postural correction encouraging patient to sit and stand with improved upright posture, posterior shoulder girdle engaged  Modalities: To ice at home  Self Care: Use of noodle for exercises and for sitting in recliner  Encouraged patient to try lying down on his bed a couple times/day  DATE: 09/01/22 Therapeutic Exercise: Chin tuck 10 sec x 5 w/noodle  Scap squeeze 10 sec x 10 w/ noodle  Active elbow, wrist, hand ROM AROM forearm supination w/ gentle stretch end range 2-3 reps 5 sec hold  Manual Therapy:  Soft tissue mobilization Rt shoulder girdle  PROM Rt shoulder flexion, scaption within tissue tolerance - pt supine  Gentle joint movement GH joint  Neuromuscular re-ed: Postural correction encouraging patient to sit and stand with improved upright posture, posterior shoulder girdle engaged  Modalities: To ice at home  Self Care: Use of noodle for exercises and for sitting in recliner  Encouraged patient to try lying down on his bed a couple times/day   PATIENT EDUCATION: Education details: POC; HEP  Person educated: Patient Education method: Explanation, Demonstration, Tactile cues, Verbal cues, and Handouts Education comprehension: verbalized understanding, returned demonstration, verbal cues required, tactile cues required, and needs further education  HOME EXERCISE PROGRAM: Access Code: [email or web address] URL: [email or web address] Date: 09/05/2022 Prepared by: Corlis Leak  Exercises - Circular  Shoulder Pendulum with Table Support  - 3-4 x daily - 7 x weekly - 1 sets - 20-30 reps - Seated Cervical Retraction  - 3 x daily - 7 x weekly - 1 sets - 10 reps - Standing Scapular Retraction  - 3 x daily - 7 x weekly - 1 sets - 10 reps - 10 hold - Standing Infraspinatus/Teres Minor Release with Ball at Wall  - 2 x daily - 7 x weekly - Standing Pectoral Release with Shoulder Abduction with Ball at Wall  - 2-3 x daily - 7 x weekly - Seated Cervical Rotation AROM  - 2 x daily - 7 x weekly - 1 sets - 5 reps - 2-3 sec  hold - Seated Cervical Sidebending AROM  - 2 x daily - 7 x weekly - 1 sets - 5 reps - 5-10 sec  hold  ASSESSMENT:  CLINICAL IMPRESSION: Patient continues to be compliant with HEP and post op precautions which were reviewed. Good tolerance of manual work and PROM in supine. No additional exercises allowed per protocol.  OBJECTIVE IMPAIRMENTS: s/p Rt RCR 08/22/22 following a fall ~ 6 weeks before with injury to Rt shoulder. He had a previous Rt RCR ~ 8 years ago. Patient presents with Rt UE in abduction sling. He has limited functional ability with Rt UE; decreased ROM, strength, function Rt UE;  continued edema Rt shoulder; poor posture and alignment; decreased scapular stability/strength.decreased activity tolerance, decreased mobility, decreased ROM, decreased strength, hypomobility, increased edema, increased fascial restrictions, impaired flexibility, impaired UE functional use, improper body mechanics, postural dysfunction, and pain.    GOALS: Goals reviewed with patient? Yes  SHORT TERM GOALS: Target date: 10/10/2022  Increase AROM Rt shoulder to 90 deg elevation Baseline: Goal status: INITIAL  2.  Independent in initial HEP  Baseline:  Goal status: INITIAL  3.  Review and progress with post op precautions, progressing as protocol dictates  Baseline:  Goal status: INITIAL   LONG TERM GOALS: Target date: 11/21/2022  Improve posture and alignment with patient to  demonstrate improved upright posture with posterior shoulder girdle engaged  Baseline:  Goal status: INITIAL  2.  Increase AROM Rt shoulder to WFL's in all planes  Baseline:  Goal status: INITIAL  3.  4/5 to 5/5 strength Rt shoulder  Baseline:  Goal status: INITIAL  4.  Patient reports ability to return to functional activities using Rt UE including dressing, grooming, yard work, ADL's  Baseline:  Goal status: INITIAL  5.  Independent HEP (including aquatic program as indicated) Baseline:  Goal status: INITIAL  6.  Improve functional limitation score to 54 Baseline: 4 Goal status: INITIAL  PLAN:  PT FREQUENCY: 2x/week  PT DURATION: 12 weeks  PLANNED INTERVENTIONS: Therapeutic exercises, Therapeutic activity, Neuromuscular re-education, Patient/Family education, Self Care, Joint mobilization, Aquatic Therapy, Dry Needling, Electrical stimulation, Cryotherapy, Moist heat, Taping, Vasopneumatic device, Ultrasound, Ionotophoresis 4mg /ml Dexamethasone, Manual therapy, and Re-evaluation  PLAN FOR NEXT SESSION: review and progress exercises per protocol; manual work, DN, modalities as indicated; progressing exercises as indicated per protocol    Val Riles, PT 09/07/2022, 10:24 AM

## 2022-09-11 ENCOUNTER — Ambulatory Visit: Payer: Medicare Other | Admitting: Rehabilitative and Restorative Service Providers"

## 2022-09-11 ENCOUNTER — Encounter: Payer: Self-pay | Admitting: Rehabilitative and Restorative Service Providers"

## 2022-09-11 DIAGNOSIS — R29898 Other symptoms and signs involving the musculoskeletal system: Secondary | ICD-10-CM

## 2022-09-11 DIAGNOSIS — M25511 Pain in right shoulder: Secondary | ICD-10-CM

## 2022-09-11 DIAGNOSIS — M6281 Muscle weakness (generalized): Secondary | ICD-10-CM

## 2022-09-11 DIAGNOSIS — R293 Abnormal posture: Secondary | ICD-10-CM

## 2022-09-11 DIAGNOSIS — M25611 Stiffness of right shoulder, not elsewhere classified: Secondary | ICD-10-CM

## 2022-09-11 NOTE — Therapy (Signed)
OUTPATIENT PHYSICAL THERAPY SHOULDER TREATMENT   Patient Name: Johnny Arnold MRN: [id number] DOB:04-14-1956, 67 y.o., male Today's Date: 09/11/2022  END OF SESSION:  PT End of Session - 09/11/22 1016     Visit Number 5    Number of Visits 24    Date for PT Re-Evaluation 11/21/22    Authorization Type blue medicare $10 copay    Progress Note Due on Visit 10    PT Start Time 1015    PT Stop Time 1100    PT Time Calculation (min) 45 min    Activity Tolerance Patient tolerated treatment well             History reviewed. No pertinent past medical history. Past Surgical History:  Procedure Laterality Date   left total hip arthroplasty Left 06/03/2019   There are no problems to display for this patient.   PCP: Dr Lasandra Beech  REFERRING PROVIDER: Shon Baton, PA-C; Dr Theophilus Bones  REFERRING DIAG: Rt RCR   THERAPY DIAG:  Acute pain of right shoulder  Stiffness of right shoulder, not elsewhere classified  Muscle weakness (generalized)  Other symptoms and signs involving the musculoskeletal system  Abnormal posture  Rationale for Evaluation and Treatment: Rehabilitation  ONSET DATE: 08/22/22  SUBJECTIVE:                                                                                                                                                                                      SUBJECTIVE STATEMENT: Patient reports some pain in the shoulder all the time - low level but pain was increased last night and this morning. He took Advil and used ice and heat to help with pain this morning and it has improved some. Patient continues with some difficulty getting in a comfortable position to sleep. Sleeping in his recliner mostly but he is trying to sleep in the bed an hour or so.  3 weeks post op on 09/12/22 RTD ~ 2 more weeks   EVAL: Patient reports that he fell off a ladder ~ 7 weeks ago with injury to Rt shoulder. Underwent Rt RCR arthroscopically 08/22/22 with  some continued bleeding following surgery. No longer bleeding (stopped Saturday 08/26/22). In abduction sling except for dressing.  Hand dominance: Right  PERTINENT HISTORY: Rt RCR ~ 8 years ago; Lt THA 5 yrs ago; lumbar surgery 15 yrs ago; history of chronic LBP; asthma; arthritis; sleep apnea  PAIN:  Are you having pain? Yes: NPRS scale: 2-3/10 (was 5-6/10 this morning  Pain location: shoulder and biceps Pain description: dull aching  Aggravating factors: moving and getting in and out of the sling  Relieving factors: meds; ice  PRECAUTIONS: Shoulder- post op protocol  WEIGHT BEARING RESTRICTIONS: Yes no wt bearing Rt UE   FALLS:  Has patient fallen in last 6 months? Yes. 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   UPPER EXTREMITY ROM:   Active/Passive ROM Right Eval Passive  Sitting  Left Eval AROM Standing   Shoulder flexion 75 deg 136  Shoulder extension  45  Shoulder abduction  140  Shoulder adduction    Shoulder internal rotation  Thumb &9 pain  Shoulder external rotation  90 shoulder at 90 deg, elbow 90 deg   Elbow flexion    Elbow extension    Wrist flexion    Wrist extension    Wrist ulnar deviation    Wrist radial deviation    Wrist pronation    Wrist supination    (Blank rows = not tested) Cervical ROM - tight end ranges greatest with Lt lateral flexion and Lt rotation   UPPER EXTREMITY MMT:  MMT Right eval Left eval  Shoulder flexion    Shoulder  extension    Shoulder abduction    Shoulder adduction    Shoulder internal rotation    Shoulder external rotation    Middle trapezius    Lower trapezius    Elbow flexion    Elbow extension    Wrist flexion    Wrist extension    Wrist ulnar deviation    Wrist radial deviation    Wrist pronation    Wrist supination    Grip strength (lbs)    (Blank rows = not tested)  PALPATION: muscular tightness Rt shoulder girdle     [facility] Adult PT Treatment:                                                  DATE: 09/11/22 Therapeutic Exercise: Chin tuck 10 sec x 5 w/noodle  Scap squeeze 10 sec x 10 w/ noodle  Active elbow, wrist, hand ROM AROM forearm supination w/ gentle stretch end range 2-3 reps 5 sec hold  Cervical rotation 3 sec x 5 Cervical lateral flexion 3 sec x 5  Lt sidelying scapular clock  Manual Therapy: Soft tissue mobilization Rt shoulder girdle  PROM Rt shoulder flexion, scaption within tissue tolerance - pt supine  Gentle joint mobs Rt GH joint  Neuromuscular re-ed: Postural correction encouraging patient to sit and stand with improved upright posture, posterior shoulder girdle engaged  Modalities: Vaso Rt shoulder 34 deg low pressure Self Care: (HEP) Myofacial ball release Rt shoulder girdle anterior/posterior Use of noodle for exercises and for sitting in recliner  Encouraged patient to try lying down on his bed a couple times/day  DATE: 09/07/22 Therapeutic Exercise: Chin tuck 10 sec x 5 w/noodle  Scap squeeze 10 sec x 10 w/ noodle  Active elbow, wrist, hand ROM AROM forearm supination w/ gentle stretch end  range 2-3 reps 5 sec hold  Cervical rotation 3 sec x 5 Cervical lateral flexion 3 sec x 5  Manual Therapy:  Soft tissue mobilization Rt shoulder girdle  PROM Rt shoulder flexion, scaption within tissue tolerance - pt supine  Gentle joint movement GH joint  Neuromuscular re-ed: Postural correction encouraging patient to sit and stand with improved upright  posture, posterior shoulder girdle engaged  Modalities: To ice at home  Self Care: Myofacial ball release Rt shoulder girdle anterior/posterior Use of noodle for exercises and for sitting in recliner  Encouraged patient to try lying down on his bed a couple times/day    PATIENT EDUCATION: Education details: POC; HEP  Person educated: Patient Education method: Explanation, Demonstration, Tactile cues, Verbal cues, and Handouts Education comprehension: verbalized understanding, returned demonstration, verbal cues required, tactile cues required, and needs further education  HOME EXERCISE PROGRAM: Access Code: [email or web address] URL: [email or web address] Date: 09/05/2022 Prepared by: Corlis Leak  Exercises - Circular Shoulder Pendulum with Table Support  - 3-4 x daily - 7 x weekly - 1 sets - 20-30 reps - Seated Cervical Retraction  - 3 x daily - 7 x weekly - 1 sets - 10 reps - Standing Scapular Retraction  - 3 x daily - 7 x weekly - 1 sets - 10 reps - 10 hold - Standing Infraspinatus/Teres Minor Release with Ball at Wall  - 2 x daily - 7 x weekly - Standing Pectoral Release with Shoulder Abduction with Ball at Wall  - 2-3 x daily - 7 x weekly - Seated Cervical Rotation AROM  - 2 x daily - 7 x weekly - 1 sets - 5 reps - 2-3 sec  hold - Seated Cervical Sidebending AROM  - 2 x daily - 7 x weekly - 1 sets - 5 reps - 5-10 sec  hold  ASSESSMENT:  CLINICAL IMPRESSION: Patient continues to work on [employer] and post op precautions. Achieved ~ 90 deg PROM Rt shoulder flexion today with some pain end range. Patient has joint tightness and discomfort with PROM. Continued with joint mobs and PROM to address joint tightness avoiding overstretch to tendons. Good tolerance of manual work and PROM in supine. No additional exercises allowed per protocol.  OBJECTIVE IMPAIRMENTS: s/p Rt RCR 08/22/22 following a fall ~ 6 weeks before with injury to Rt shoulder. He had a previous Rt RCR ~ 8 years ago.  Patient presents with Rt UE in abduction sling. He has limited functional ability with Rt UE; decreased ROM, strength, function Rt UE; continued edema Rt shoulder; poor posture and alignment; decreased scapular stability/strength.decreased activity tolerance, decreased mobility, decreased ROM, decreased strength, hypomobility, increased edema, increased fascial restrictions, impaired flexibility, impaired UE functional use, improper body mechanics, postural dysfunction, and pain.    GOALS: Goals reviewed with patient? Yes  SHORT TERM GOALS: Target date: 10/10/2022  Increase AROM Rt shoulder to 90 deg elevation Baseline: Goal status: INITIAL  2.  Independent in initial HEP  Baseline:  Goal status: INITIAL  3.  Review and progress with post op precautions, progressing as protocol dictates  Baseline:  Goal status: INITIAL   LONG TERM GOALS: Target date: 11/21/2022  Improve posture and alignment with patient to demonstrate improved upright posture with posterior shoulder girdle engaged  Baseline:  Goal status: INITIAL  2.  Increase AROM Rt shoulder to WFL's in all planes  Baseline:  Goal status: INITIAL  3.  4/5 to 5/5 strength Rt shoulder  Baseline:  Goal status: INITIAL  4.  Patient  reports ability to return to functional activities using Rt UE including dressing, grooming, yard work, ADL's  Baseline:  Goal status: INITIAL  5.  Independent HEP (including aquatic program as indicated) Baseline:  Goal status: INITIAL  6.  Improve functional limitation score to 54 Baseline: 4 Goal status: INITIAL  PLAN:  PT FREQUENCY: 2x/week  PT DURATION: 12 weeks  PLANNED INTERVENTIONS: Therapeutic exercises, Therapeutic activity, Neuromuscular re-education, Patient/Family education, Self Care, Joint mobilization, Aquatic Therapy, Dry Needling, Electrical stimulation, Cryotherapy, Moist heat, Taping, Vasopneumatic device, Ultrasound, Ionotophoresis 4mg /ml Dexamethasone, Manual therapy,  and Re-evaluation  PLAN FOR NEXT SESSION: review and progress exercises per protocol; manual work, DN, modalities as indicated; progressing exercises as indicated per protocol    Areeb Corron Rober Minion, PT 09/11/2022, 10:20 AM

## 2022-09-14 ENCOUNTER — Encounter: Payer: Self-pay | Admitting: Rehabilitative and Restorative Service Providers"

## 2022-09-14 ENCOUNTER — Ambulatory Visit: Payer: Medicare Other | Admitting: Rehabilitative and Restorative Service Providers"

## 2022-09-14 DIAGNOSIS — M25611 Stiffness of right shoulder, not elsewhere classified: Secondary | ICD-10-CM

## 2022-09-14 DIAGNOSIS — R293 Abnormal posture: Secondary | ICD-10-CM

## 2022-09-14 DIAGNOSIS — R29898 Other symptoms and signs involving the musculoskeletal system: Secondary | ICD-10-CM

## 2022-09-14 DIAGNOSIS — M25511 Pain in right shoulder: Secondary | ICD-10-CM

## 2022-09-14 DIAGNOSIS — M6281 Muscle weakness (generalized): Secondary | ICD-10-CM

## 2022-09-14 NOTE — Therapy (Signed)
OUTPATIENT PHYSICAL THERAPY SHOULDER TREATMENT   Patient Name: Johnny Arnold MRN: [id number] DOB:[birth year], 67 y.o., male Today's Date: 09/14/2022  END OF SESSION:  PT End of Session - 09/14/22 1019     Visit Number 6    Number of Visits 24    Date for PT Re-Evaluation 11/21/22    Authorization Type [employer] $10 copay    Progress Note Due on Visit 10    PT Start Time 1017    PT Stop Time 1105    PT Time Calculation (min) 48 min    Activity Tolerance Patient tolerated treatment well             History reviewed. No pertinent past medical history. Past Surgical History:  Procedure Laterality Date   left total hip arthroplasty Left 06/03/2019   There are no problems to display for this patient.   PCP: Dr Lasandra Beech  REFERRING PROVIDER: Shon Baton, PA-C; Dr Theophilus Bones  REFERRING DIAG: Rt RCR   THERAPY DIAG:  Acute pain of right shoulder  Stiffness of right shoulder, not elsewhere classified  Muscle weakness (generalized)  Other symptoms and signs involving the musculoskeletal system  Abnormal posture  Rationale for Evaluation and Treatment: Rehabilitation  ONSET DATE: 08/22/22  SUBJECTIVE:                                                                                                                                                                                      SUBJECTIVE STATEMENT: Patient reports some pain in the shoulder all the time - low level but pain was increased at night and this morning. Vaso helped with pain at last visit. Working on exercises without difficulty.  3 weeks post op on 09/12/22 RTD   EVAL: Patient reports that he fell off a ladder ~ 7 weeks ago with injury to Rt shoulder. Underwent Rt RCR arthroscopically 08/22/22 with some continued bleeding following surgery. No longer bleeding (stopped Saturday 08/26/22). In abduction sling except for dressing.  Hand dominance: Right  PERTINENT HISTORY: Rt RCR ~ 8 years ago; Lt  THA 5 yrs ago; lumbar surgery 15 yrs ago; history of chronic LBP; asthma; arthritis; sleep apnea  PAIN:  Are you having pain? Yes: NPRS scale: 2-3/10 (was 5-6/10 this morning  Pain location: shoulder and biceps Pain description: dull aching  Aggravating factors: moving and getting in and out of the sling  Relieving factors: meds; ice  PRECAUTIONS: Shoulder- post op protocol  WEIGHT BEARING RESTRICTIONS: Yes no wt bearing Rt UE   FALLS:  Has patient fallen in last 6 months? Yes. 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   UPPER EXTREMITY ROM:   Active/Passive ROM Right Eval Passive  Sitting  Left Eval AROM Standing   Shoulder flexion 75 deg 136  Shoulder extension  45  Shoulder abduction  140  Shoulder adduction    Shoulder internal rotation  Thumb &9 pain  Shoulder external rotation  90 shoulder at 90 deg, elbow 90 deg   Elbow flexion    Elbow extension    Wrist flexion    Wrist extension    Wrist ulnar deviation    Wrist radial deviation    Wrist pronation    Wrist supination    (Blank rows = not tested) Cervical ROM - tight end ranges greatest with Lt lateral flexion and Lt rotation   UPPER EXTREMITY MMT:  MMT Right eval Left eval  Shoulder flexion    Shoulder extension    Shoulder abduction    Shoulder adduction    Shoulder internal rotation    Shoulder external rotation    Middle trapezius    Lower trapezius    Elbow flexion    Elbow extension     Wrist flexion    Wrist extension    Wrist ulnar deviation    Wrist radial deviation    Wrist pronation    Wrist supination    Grip strength (lbs)    (Blank rows = not tested)  PALPATION: muscular tightness Rt shoulder girdle     [facility] Adult PT Treatment:                                                  DATE: 09/14/22 Therapeutic Exercise: Chin tuck 10 sec x 5 w/noodle  Scap squeeze 10 sec x 10 w/ noodle  Active elbow, wrist, hand ROM AROM forearm supination w/ gentle stretch end range 2-3 reps 5 sec hold  Cervical rotation 3 sec x 5 Cervical lateral flexion 3 sec x 5  Lt sidelying scapular clock  Manual Therapy: Soft tissue mobilization Rt shoulder girdle  PROM Rt shoulder flexion, scaption within tissue tolerance - pt supine  Gentle joint mobs Rt GH joint  Neuromuscular re-ed: Postural correction encouraging patient to sit and stand with improved upright posture, posterior shoulder girdle engaged  Modalities: Vaso Rt shoulder 34 deg low pressure Self Care: (HEP) Myofacial ball release Rt shoulder girdle anterior/posterior Use of noodle for exercises and for sitting in recliner  Encouraged patient to try lying down on his bed a couple times/day  DATE: 09/11/22 Therapeutic Exercise: Chin tuck 10 sec x 5 w/noodle  Scap squeeze 10 sec x 10 w/ noodle  Active elbow, wrist, hand ROM AROM forearm supination w/ gentle stretch end range 2-3 reps 5 sec hold  Cervical rotation 3 sec x 5 Cervical lateral flexion 3 sec x 5  Lt sidelying scapular clock  Manual Therapy: Soft tissue mobilization Rt shoulder girdle  PROM Rt shoulder flexion, scaption within  tissue tolerance - pt supine  Gentle joint mobs Rt GH joint  Neuromuscular re-ed: Postural correction encouraging patient to sit and stand with improved upright posture, posterior shoulder girdle engaged  Modalities: Vaso Rt shoulder 34 deg low pressure Self Care: (HEP) Myofacial ball release Rt shoulder girdle  anterior/posterior Use of noodle for exercises and for sitting in recliner  Encouraged patient to try lying down on his bed a couple times/day    PATIENT EDUCATION: Education details: POC; HEP  Person educated: Patient Education method: Explanation, Demonstration, Tactile cues, Verbal cues, and Handouts Education comprehension: verbalized understanding, returned demonstration, verbal cues required, tactile cues required, and needs further education  HOME EXERCISE PROGRAM: Access Code: [email or web address] URL: [email or web address] Date: 09/05/2022 Prepared by: Corlis Leak  Exercises - Circular Shoulder Pendulum with Table Support  - 3-4 x daily - 7 x weekly - 1 sets - 20-30 reps - Seated Cervical Retraction  - 3 x daily - 7 x weekly - 1 sets - 10 reps - Standing Scapular Retraction  - 3 x daily - 7 x weekly - 1 sets - 10 reps - 10 hold - Standing Infraspinatus/Teres Minor Release with Ball at Wall  - 2 x daily - 7 x weekly - Standing Pectoral Release with Shoulder Abduction with Ball at Wall  - 2-3 x daily - 7 x weekly - Seated Cervical Rotation AROM  - 2 x daily - 7 x weekly - 1 sets - 5 reps - 2-3 sec  hold - Seated Cervical Sidebending AROM  - 2 x daily - 7 x weekly - 1 sets - 5 reps - 5-10 sec  hold  ASSESSMENT:  CLINICAL IMPRESSION: Patient continues to work on HEP with in post op precautions. Achieved ~ 95 deg PROM Rt shoulder flexion today with some pain end range. Patient has joint tightness and discomfort with PROM. Continued with joint mobs and PROM to address joint tightness avoiding overstretch to tendons. Good tolerance of manual work and PROM in supine. No additional exercises allowed per protocol.  OBJECTIVE IMPAIRMENTS: s/p Rt RCR 08/22/22 following a fall ~ 6 weeks before with injury to Rt shoulder. He had a previous Rt RCR ~ 8 years ago. Patient presents with Rt UE in abduction sling. He has limited functional ability with Rt UE; decreased ROM, strength, function Rt  UE; continued edema Rt shoulder; poor posture and alignment; decreased scapular stability/strength.decreased activity tolerance, decreased mobility, decreased ROM, decreased strength, hypomobility, increased edema, increased fascial restrictions, impaired flexibility, impaired UE functional use, improper body mechanics, postural dysfunction, and pain.    GOALS: Goals reviewed with patient? Yes  SHORT TERM GOALS: Target date: 10/10/2022  Increase AROM Rt shoulder to 90 deg elevation Baseline: Goal status: INITIAL  2.  Independent in initial HEP  Baseline:  Goal status: INITIAL  3.  Review and progress with post op precautions, progressing as protocol dictates  Baseline:  Goal status: INITIAL   LONG TERM GOALS: Target date: 11/21/2022  Improve posture and alignment with patient to demonstrate improved upright posture with posterior shoulder girdle engaged  Baseline:  Goal status: INITIAL  2.  Increase AROM Rt shoulder to WFL's in all planes  Baseline:  Goal status: INITIAL  3.  4/5 to 5/5 strength Rt shoulder  Baseline:  Goal status: INITIAL  4.  Patient reports ability to return to functional activities using Rt UE including dressing, grooming, yard work, ADL's  Baseline:  Goal status: INITIAL  5.  Independent HEP (including aquatic program as indicated)  Baseline:  Goal status: INITIAL  6.  Improve functional limitation score to 54 Baseline: 4 Goal status: INITIAL  PLAN:  PT FREQUENCY: 2x/week  PT DURATION: 12 weeks  PLANNED INTERVENTIONS: Therapeutic exercises, Therapeutic activity, Neuromuscular re-education, Patient/Family education, Self Care, Joint mobilization, Aquatic Therapy, Dry Needling, Electrical stimulation, Cryotherapy, Moist heat, Taping, Vasopneumatic device, Ultrasound, Ionotophoresis 4mg /ml Dexamethasone, Manual therapy, and Re-evaluation  PLAN FOR NEXT SESSION: review and progress exercises per protocol; manual work, DN, modalities as indicated;  progressing exercises as indicated per protocol    Syenna Nazir Rober Minion, PT 09/14/2022, 10:20 AM

## 2022-09-18 ENCOUNTER — Ambulatory Visit: Payer: Medicare Other | Admitting: Rehabilitative and Restorative Service Providers"

## 2022-09-18 ENCOUNTER — Encounter: Payer: Self-pay | Admitting: Rehabilitative and Restorative Service Providers"

## 2022-09-18 DIAGNOSIS — M25611 Stiffness of right shoulder, not elsewhere classified: Secondary | ICD-10-CM

## 2022-09-18 DIAGNOSIS — M25511 Pain in right shoulder: Secondary | ICD-10-CM | POA: Diagnosis not present

## 2022-09-18 DIAGNOSIS — R293 Abnormal posture: Secondary | ICD-10-CM

## 2022-09-18 DIAGNOSIS — M6281 Muscle weakness (generalized): Secondary | ICD-10-CM

## 2022-09-18 DIAGNOSIS — R29898 Other symptoms and signs involving the musculoskeletal system: Secondary | ICD-10-CM

## 2022-09-18 NOTE — Therapy (Signed)
OUTPATIENT PHYSICAL THERAPY SHOULDER TREATMENT   Patient Name: Johnny Arnold MRN: [id number] DOB:04/06/1956, 67 y.o., male Today's Date: 09/18/2022  END OF SESSION:    History reviewed. No pertinent past medical history. Past Surgical History:  Procedure Laterality Date   left total hip arthroplasty Left 06/03/2019   There are no problems to display for this patient.   PCP: Dr Lasandra Beech  REFERRING PROVIDER: Shon Baton, PA-C; Dr Theophilus Bones  REFERRING DIAG: Rt RCR   THERAPY DIAG:  Acute pain of right shoulder  Stiffness of right shoulder, not elsewhere classified  Muscle weakness (generalized)  Other symptoms and signs involving the musculoskeletal system  Abnormal posture  Rationale for Evaluation and Treatment: Rehabilitation  ONSET DATE: 08/22/22  SUBJECTIVE:                                                                                                                                                                                      SUBJECTIVE STATEMENT: Patient reports some pain in the shoulder all the time - low level but pain was increased at night and this morning. Vaso helped with pain at last visit. Working on exercises without difficulty.  3 weeks post op on 09/12/22 RTD   EVAL: Patient reports that he fell off a ladder ~ 7 weeks ago with injury to Rt shoulder. Underwent Rt RCR arthroscopically 08/22/22 with some continued bleeding following surgery. No longer bleeding (stopped Saturday 08/26/22). In abduction sling except for dressing.  Hand dominance: Right  PERTINENT HISTORY: Rt RCR ~ 8 years ago; Lt THA 5 yrs ago; lumbar surgery 15 yrs ago; history of chronic LBP; asthma; arthritis; sleep apnea  PAIN:  Are you having pain? Yes: NPRS scale: 2-3/10 (was 5-6/10 this morning  Pain location: shoulder and biceps Pain description: dull aching  Aggravating factors: moving and getting in and out of the sling  Relieving factors: meds;  ice  PRECAUTIONS: Shoulder- post op protocol  WEIGHT BEARING RESTRICTIONS: Yes no wt bearing Rt UE   FALLS:  Has patient fallen in last 6 months? Yes. 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   UPPER EXTREMITY ROM:   Active/Passive  ROM Right Eval Passive  Sitting  Rtight  09/18/22 Passive  Supine  Left Eval AROM Standing   Shoulder flexion 75 deg 110 136  Shoulder extension   45  Shoulder abduction   140  Shoulder adduction     Shoulder internal rotation   Thumb &9 pain  Shoulder external rotation   90 shoulder at 90 deg, elbow 90 deg   Elbow flexion     Elbow extension     Wrist flexion     Wrist extension     Wrist ulnar deviation     Wrist radial deviation     Wrist pronation     Wrist supination     (Blank rows = not tested) Cervical ROM - tight end ranges greatest with Lt lateral flexion and Lt rotation   UPPER EXTREMITY MMT:  MMT Right eval Left eval  Shoulder flexion    Shoulder extension    Shoulder abduction    Shoulder adduction    Shoulder internal rotation    Shoulder external rotation    Middle trapezius    Lower trapezius    Elbow flexion    Elbow extension    Wrist flexion    Wrist extension    Wrist ulnar deviation    Wrist radial deviation    Wrist pronation    Wrist supination    Grip strength (lbs)    (Blank rows = not tested)  PALPATION: muscular tightness Rt shoulder girdle     [facility] Adult PT Treatment:                                                   DATE: 09/18/22 Therapeutic Exercise: Pendulum 30 CW; 30 CCW  Chin tuck 10 sec x 5 w/noodle  Scap squeeze 10 sec x 10 w/ noodle  Active elbow, wrist, hand ROM AROM forearm supination w/ gentle stretch end range 2-3 reps 5 sec hold  Isometric shoulder abduction 5 sec x 5 Isometric ER 5 sec x 5 Isometric shoulder extension 5 sec x 5 Isometric shoulder IR 5 sec x 5  Lt sidelying scapular clock  Manual Therapy: Soft tissue mobilization Rt shoulder girdle  PROM Rt shoulder flexion, scaption within tissue tolerance - pt supine  Joint mobs Rt GH joint manually and using pillow case for mobs  Scar massage  Neuromuscular re-ed: Postural correction encouraging patient to sit and stand with improved upright posture, posterior shoulder girdle engaged  Modalities: Vaso Rt shoulder 34 deg low pressure Self Care: (HEP) Myofacial ball release Rt shoulder girdle anterior/posterior Use of noodle for exercises and for sitting in recliner  Encouraged patient to try lying down on his bed a couple times/day Instructed in scar massage for home 2x/day ~ 5 min each scope site   DATE: 09/14/22 Therapeutic Exercise: Chin tuck 10 sec x 5 w/noodle  Scap squeeze 10 sec x 10 w/ noodle  Active elbow, wrist, hand ROM AROM forearm supination w/ gentle stretch end range 2-3 reps 5 sec hold  Cervical rotation 3 sec x 5 Cervical lateral flexion 3 sec x 5  Lt sidelying scapular clock  Manual Therapy: Soft tissue mobilization Rt shoulder girdle  PROM Rt shoulder flexion, scaption within tissue tolerance - pt supine  Gentle joint mobs Rt GH joint  Neuromuscular re-ed: Postural correction encouraging patient to sit and stand with improved  upright posture, posterior shoulder girdle engaged  Modalities: Vaso Rt shoulder 34 deg low pressure Self Care: (HEP) Myofacial ball release Rt shoulder girdle anterior/posterior Use of noodle for exercises and for sitting in recliner  Encouraged  patient to try lying down on his bed a couple times/day   PATIENT EDUCATION: Education details: POC; HEP  Person educated: Patient Education method: Explanation, Demonstration, Tactile cues, Verbal cues, and Handouts Education comprehension: verbalized understanding, returned demonstration, verbal cues required, tactile cues required, and needs further education  HOME EXERCISE PROGRAM: Access Code: [email or web address] URL: [email or web address] Date: 09/18/2022 Prepared by: Corlis Leak  Exercises - Circular Shoulder Pendulum with Table Support  - 3-4 x daily - 7 x weekly - 1 sets - 20-30 reps - Seated Cervical Retraction  - 3 x daily - 7 x weekly - 1 sets - 10 reps - Standing Scapular Retraction  - 3 x daily - 7 x weekly - 1 sets - 10 reps - 10 hold - Standing Infraspinatus/Teres Minor Release with Ball at Wall  - 2 x daily - 7 x weekly - Standing Pectoral Release with Shoulder Abduction with Ball at Wall  - 2-3 x daily - 7 x weekly - Seated Cervical Rotation AROM  - 2 x daily - 7 x weekly - 1 sets - 5 reps - 2-3 sec  hold - Seated Cervical Sidebending AROM  - 2 x daily - 7 x weekly - 1 sets - 5 reps - 5-10 sec  hold - Isometric Shoulder Abduction at Wall  - 2 x daily - 7 x weekly - 1 sets - 5-10 reps - 5 sec  hold - Isometric Shoulder External Rotation at Wall  - 2 x daily - 7 x weekly - 1 sets - 5 reps - 5 sec  hold - Isometric Shoulder Extension at Wall  - 2 x daily - 7 x weekly - 1 sets - 5-10 reps - 5 sec  hold - Standing Isometric Shoulder Internal Rotation with Towel Roll at Doorway  - 2 x daily - 7 x weekly - 1 sets - 5 reps - 5 sec  hold - Shoulder Scar Massage  - 2 x daily - 7 x weekly - 1 sets - 1 reps - 5 min  hold  ASSESSMENT:  CLINICAL IMPRESSION: Patient continues to work on HEP with in post op precautions. Achieved ~ 110 deg PROM Rt shoulder flexion today with some pain end range. Patient has joint tightness and discomfort with PROM. Continued with joint mobs and  PROM to address joint tightness avoiding overstretch to tendons. Good tolerance of manual work and PROM in supine. Added scar massage and gentle isometrics for shoulder.  OBJECTIVE IMPAIRMENTS: s/p Rt RCR 08/22/22 following a fall ~ 6 weeks before with injury to Rt shoulder. He had a previous Rt RCR ~ 8 years ago. Patient presents with Rt UE in abduction sling. He has limited functional ability with Rt UE; decreased ROM, strength, function Rt UE; continued edema Rt shoulder; poor posture and alignment; decreased scapular stability/strength.decreased activity tolerance, decreased mobility, decreased ROM, decreased strength, hypomobility, increased edema, increased fascial restrictions, impaired flexibility, impaired UE functional use, improper body mechanics, postural dysfunction, and pain.    GOALS: Goals reviewed with patient? Yes  SHORT TERM GOALS: Target date: 10/10/2022  Increase AROM Rt shoulder to 90 deg elevation Baseline: Goal status: INITIAL  2.  Independent in initial HEP  Baseline:  Goal status: INITIAL  3.  Review and progress with post op precautions,  progressing as protocol dictates  Baseline:  Goal status: INITIAL   LONG TERM GOALS: Target date: 11/21/2022  Improve posture and alignment with patient to demonstrate improved upright posture with posterior shoulder girdle engaged  Baseline:  Goal status: INITIAL  2.  Increase AROM Rt shoulder to WFL's in all planes  Baseline:  Goal status: INITIAL  3.  4/5 to 5/5 strength Rt shoulder  Baseline:  Goal status: INITIAL  4.  Patient reports ability to return to functional activities using Rt UE including dressing, grooming, yard work, ADL's  Baseline:  Goal status: INITIAL  5.  Independent HEP (including aquatic program as indicated) Baseline:  Goal status: INITIAL  6.  Improve functional limitation score to 54 Baseline: 4 Goal status: INITIAL  PLAN:  PT FREQUENCY: 2x/week  PT DURATION: 12 weeks  PLANNED  INTERVENTIONS: Therapeutic exercises, Therapeutic activity, Neuromuscular re-education, Patient/Family education, Self Care, Joint mobilization, Aquatic Therapy, Dry Needling, Electrical stimulation, Cryotherapy, Moist heat, Taping, Vasopneumatic device, Ultrasound, Ionotophoresis /ml Dexamethasone, Manual therapy, and Re-evaluation  PLAN FOR NEXT SESSION: review and progress exercises per protocol; manual work, DN, modalities as indicated; progressing exercises as indicated per protocol    Val Riles, PT 09/18/2022, 11:04 AM

## 2022-09-19 ENCOUNTER — Ambulatory Visit: Payer: Medicare Other | Admitting: Rehabilitative and Restorative Service Providers"

## 2022-09-21 ENCOUNTER — Ambulatory Visit: Payer: Medicare Other | Admitting: Rehabilitative and Restorative Service Providers"

## 2022-09-21 ENCOUNTER — Encounter: Payer: Self-pay | Admitting: Rehabilitative and Restorative Service Providers"

## 2022-09-21 DIAGNOSIS — M25511 Pain in right shoulder: Secondary | ICD-10-CM | POA: Diagnosis not present

## 2022-09-21 DIAGNOSIS — M6281 Muscle weakness (generalized): Secondary | ICD-10-CM

## 2022-09-21 DIAGNOSIS — R29898 Other symptoms and signs involving the musculoskeletal system: Secondary | ICD-10-CM

## 2022-09-21 DIAGNOSIS — R293 Abnormal posture: Secondary | ICD-10-CM

## 2022-09-21 DIAGNOSIS — M25611 Stiffness of right shoulder, not elsewhere classified: Secondary | ICD-10-CM

## 2022-09-21 NOTE — Therapy (Signed)
OUTPATIENT PHYSICAL THERAPY SHOULDER TREATMENT   Patient Name: Johnny Arnold MRN: [id number] DOB:[birth year], 67 y.o., male Today's Date: 09/21/2022  END OF SESSION:  PT End of Session - 09/21/22 1020     Visit Number 8    Number of Visits 24    Date for PT Re-Evaluation 11/21/22    Authorization Type blue medicare $10 copay    Progress Note Due on Visit 10    PT Start Time 1015    PT Stop Time 1103    PT Time Calculation (min) 48 min    Activity Tolerance Patient tolerated treatment well              History reviewed. No pertinent past medical history. Past Surgical History:  Procedure Laterality Date   left total hip arthroplasty Left 06/03/2019   There are no problems to display for this patient.   PCP: Dr Lasandra Beech  REFERRING PROVIDER: Shon Baton, PA-C; Dr Theophilus Bones  REFERRING DIAG: Rt RCR   THERAPY DIAG:  Acute pain of right shoulder  Stiffness of right shoulder, not elsewhere classified  Muscle weakness (generalized)  Other symptoms and signs involving the musculoskeletal system  Abnormal posture  Rationale for Evaluation and Treatment: Rehabilitation  ONSET DATE: 08/22/22  SUBJECTIVE:                                                                                                                                                                                      SUBJECTIVE STATEMENT: Patient reports some pain in the shoulder all the time - low level but pain was increased at night and this morning. Vaso helps after therapy. Working on exercises without difficulty. 4 weeks 2 days post op today   EVAL: Patient reports that he fell off a ladder ~ 7 weeks ago with injury to Rt shoulder. Underwent Rt RCR arthroscopically 08/22/22 with some continued bleeding following surgery. No longer bleeding (stopped Saturday 08/26/22). In abduction sling except for dressing.  Hand dominance: Right  PERTINENT HISTORY: Rt RCR ~ 8 years ago; Lt THA 5 yrs  ago; lumbar surgery 15 yrs ago; history of chronic LBP; asthma; arthritis; sleep apnea  PAIN:  Are you having pain? Yes: NPRS scale: 2-3/10 (was 5-6/10 this morning  Pain location: shoulder and biceps Pain description: dull aching  Aggravating factors: moving and getting in and out of the sling  Relieving factors: meds; ice  PRECAUTIONS: Shoulder- post op protocol  WEIGHT BEARING RESTRICTIONS: Yes no wt bearing Rt UE   FALLS:  Has patient fallen in last 6 months? Yes. 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   UPPER EXTREMITY ROM:   Active/Passive ROM Right Eval Passive  Sitting  Rtight  09/21/22 Passive  Supine  Left Eval AROM Standing   Shoulder flexion 75 deg 112 136  Shoulder extension   45  Shoulder abduction   140  Shoulder adduction     Shoulder internal rotation   Thumb &9 pain  Shoulder external rotation   90 shoulder at 90 deg, elbow 90 deg   Elbow flexion     Elbow extension     Wrist flexion     Wrist extension     Wrist ulnar deviation     Wrist radial deviation     Wrist pronation     Wrist supination     (Blank rows = not tested) Cervical ROM - tight end ranges greatest with Lt lateral flexion and Lt rotation   UPPER EXTREMITY MMT:  MMT Right eval Left eval  Shoulder flexion    Shoulder extension    Shoulder abduction    Shoulder adduction    Shoulder internal rotation    Shoulder external rotation    Middle trapezius    Lower  trapezius    Elbow flexion    Elbow extension    Wrist flexion    Wrist extension    Wrist ulnar deviation    Wrist radial deviation    Wrist pronation    Wrist supination    Grip strength (lbs)    (Blank rows = not tested)  PALPATION: muscular tightness Rt shoulder girdle     [facility] Adult PT Treatment:                                                  DATE: 09/21/22 Therapeutic Exercise: Pendulum 30 CW; 30 CCW  Chin tuck 10 sec x 5 w/noodle  Scap squeeze 10 sec x 10 w/ noodle  Active elbow, wrist, hand ROM AROM forearm supination w/ gentle stretch end range 2-3 reps 5 sec hold  Isometric shoulder abduction 5 sec x 5 Isometric ER 5 sec x 5 Isometric shoulder extension 5 sec x 5 Isometric shoulder IR 5 sec x 5  Lt sidelying scapular clock  Manual Therapy: Soft tissue mobilization Rt shoulder girdle  PROM Rt shoulder flexion, scaption within tissue tolerance - pt supine  Joint mobs Rt GH joint manually and using pillow case for mobs  Scar massage  Neuromuscular re-ed: Postural correction encouraging patient to sit and stand with improved upright posture, posterior shoulder girdle engaged  Modalities: Vaso Rt shoulder 34 deg low pressure Self Care: (HEP) Myofacial ball release Rt shoulder girdle anterior/posterior Use of noodle for exercises and for sitting in recliner  Encouraged patient to try lying down on his bed a couple times/day Instructed in scar massage for home 2x/day ~ 5 min each scope site   DATE: 09/18/22 Therapeutic Exercise: Pendulum 30 CW; 30 CCW  Chin tuck 10  sec x 5 w/noodle  Scap squeeze 10 sec x 10 w/ noodle  Active elbow, wrist, hand ROM AROM forearm supination w/ gentle stretch end range 2-3 reps 5 sec hold  Isometric shoulder abduction 5 sec x 5 Isometric ER 5 sec x 5 Isometric shoulder extension 5 sec x 5 Isometric shoulder IR 5 sec x 5  Lt sidelying scapular clock  Manual Therapy: Soft tissue mobilization Rt shoulder girdle  PROM Rt shoulder  flexion, scaption within tissue tolerance - pt supine  Joint mobs Rt GH joint manually and using pillow case for mobs  Scar massage  Neuromuscular re-ed: Postural correction encouraging patient to sit and stand with improved upright posture, posterior shoulder girdle engaged  Modalities: Vaso Rt shoulder 34 deg low pressure Self Care: (HEP) Myofacial ball release Rt shoulder girdle anterior/posterior Use of noodle for exercises and for sitting in recliner  Encouraged patient to try lying down on his bed a couple times/day Instructed in scar massage for home 2x/day ~ 5 min each scope site   PATIENT EDUCATION: Education details: POC; HEP  Person educated: Patient Education method: Programmer, multimedia, Demonstration, Tactile cues, Verbal cues, and Handouts Education comprehension: verbalized understanding, returned demonstration, verbal cues required, tactile cues required, and needs further education  HOME EXERCISE PROGRAM: Access Code: [email or web address] URL: [email or web address] Date: 09/18/2022 Prepared by: Corlis Leak  Exercises - Circular Shoulder Pendulum with Table Support  - 3-4 x daily - 7 x weekly - 1 sets - 20-30 reps - Seated Cervical Retraction  - 3 x daily - 7 x weekly - 1 sets - 10 reps - Standing Scapular Retraction  - 3 x daily - 7 x weekly - 1 sets - 10 reps - 10 hold - Standing Infraspinatus/Teres Minor Release with Ball at Wall  - 2 x daily - 7 x weekly - Standing Pectoral Release with Shoulder Abduction with Ball at Wall  - 2-3 x daily - 7 x weekly - Seated Cervical Rotation AROM  - 2 x daily - 7 x weekly - 1 sets - 5 reps - 2-3 sec  hold - Seated Cervical Sidebending AROM  - 2 x daily - 7 x weekly - 1 sets - 5 reps - 5-10 sec  hold - Isometric Shoulder Abduction at Wall  - 2 x daily - 7 x weekly - 1 sets - 5-10 reps - 5 sec  hold - Isometric Shoulder External Rotation at Wall  - 2 x daily - 7 x weekly - 1 sets - 5 reps - 5 sec  hold - Isometric Shoulder Extension at  Wall  - 2 x daily - 7 x weekly - 1 sets - 5-10 reps - 5 sec  hold - Standing Isometric Shoulder Internal Rotation with Towel Roll at Doorway  - 2 x daily - 7 x weekly - 1 sets - 5 reps - 5 sec  hold - Shoulder Scar Massage  - 2 x daily - 7 x weekly - 1 sets - 1 reps - 5 min  hold  ASSESSMENT:  CLINICAL IMPRESSION: Patient continues to work on HEP with in post op precautions. Achieved ~ 112 deg PROM Rt shoulder flexion today with some pain end range. Patient has joint tightness and discomfort with PROM. Continued with joint mobs and PROM to address joint tightness avoiding overstretch to tendons. Good tolerance of manual work and PROM in supine. Working on scar massage and gentle isometrics for shoulder. Progressing well with shoulder rehab per protocol.   OBJECTIVE IMPAIRMENTS: s/p  Rt RCR 08/22/22 following a fall ~ 6 weeks before with injury to Rt shoulder. He had a previous Rt RCR ~ 8 years ago. Patient presents with Rt UE in abduction sling. He has limited functional ability with Rt UE; decreased ROM, strength, function Rt UE; continued edema Rt shoulder; poor posture and alignment; decreased scapular stability/strength.decreased activity tolerance, decreased mobility, decreased ROM, decreased strength, hypomobility, increased edema, increased fascial restrictions, impaired flexibility, impaired UE functional use, improper body mechanics, postural dysfunction, and pain.    GOALS: Goals reviewed with patient? Yes  SHORT TERM GOALS: Target date: 10/10/2022  Increase AROM Rt shoulder to 90 deg elevation Baseline: Goal status: INITIAL  2.  Independent in initial HEP  Baseline:  Goal status: INITIAL  3.  Review and progress with post op precautions, progressing as protocol dictates  Baseline:  Goal status: INITIAL   LONG TERM GOALS: Target date: 11/21/2022  Improve posture and alignment with patient to demonstrate improved upright posture with posterior shoulder girdle engaged  Baseline:   Goal status: INITIAL  2.  Increase AROM Rt shoulder to WFL's in all planes  Baseline:  Goal status: INITIAL  3.  4/5 to 5/5 strength Rt shoulder  Baseline:  Goal status: INITIAL  4.  Patient reports ability to return to functional activities using Rt UE including dressing, grooming, yard work, ADL's  Baseline:  Goal status: INITIAL  5.  Independent HEP (including aquatic program as indicated) Baseline:  Goal status: INITIAL  6.  Improve functional limitation score to 54 Baseline: 4 Goal status: INITIAL  PLAN:  PT FREQUENCY: 2x/week  PT DURATION: 12 weeks  PLANNED INTERVENTIONS: Therapeutic exercises, Therapeutic activity, Neuromuscular re-education, Patient/Family education, Self Care, Joint mobilization, Aquatic Therapy, Dry Needling, Electrical stimulation, Cryotherapy, Moist heat, Taping, Vasopneumatic device, Ultrasound, Ionotophoresis /ml Dexamethasone, Manual therapy, and Re-evaluation  PLAN FOR NEXT SESSION: review and progress exercises per protocol; manual work, DN, modalities as indicated; progressing exercises as indicated per protocol    Migdalia Olejniczak Rober Minion, PT 09/21/2022, 10:20 AM

## 2022-09-26 ENCOUNTER — Ambulatory Visit: Payer: Medicare Other | Admitting: Physical Therapy

## 2022-09-26 ENCOUNTER — Encounter: Payer: Self-pay | Admitting: Physical Therapy

## 2022-09-26 DIAGNOSIS — M25611 Stiffness of right shoulder, not elsewhere classified: Secondary | ICD-10-CM

## 2022-09-26 DIAGNOSIS — M25511 Pain in right shoulder: Secondary | ICD-10-CM | POA: Diagnosis not present

## 2022-09-26 DIAGNOSIS — R29898 Other symptoms and signs involving the musculoskeletal system: Secondary | ICD-10-CM

## 2022-09-26 DIAGNOSIS — R293 Abnormal posture: Secondary | ICD-10-CM

## 2022-09-26 DIAGNOSIS — M6281 Muscle weakness (generalized): Secondary | ICD-10-CM

## 2022-09-26 NOTE — Therapy (Signed)
OUTPATIENT PHYSICAL THERAPY SHOULDER TREATMENT   Patient Name: Johnny Arnold MRN: [id number] DOB:03/18/1956, 67 y.o., male Today's Date: 09/26/2022  END OF SESSION:  PT End of Session - 09/26/22 1016     Visit Number 9    Number of Visits 24    Date for PT Re-Evaluation 11/21/22    Authorization Type blue medicare $10 copay    Progress Note Due on Visit 10    PT Start Time 1016    PT Stop Time 1100    PT Time Calculation (min) 44 min    Activity Tolerance Patient tolerated treatment well              History reviewed. No pertinent past medical history. Past Surgical History:  Procedure Laterality Date   left total hip arthroplasty Left 06/03/2019   There are no problems to display for this patient.   PCP: Dr Lasandra Beech  REFERRING PROVIDER: Shon Baton, PA-C; Dr Theophilus Bones  REFERRING DIAG: Rt RCR   THERAPY DIAG:  Acute pain of right shoulder  Stiffness of right shoulder, not elsewhere classified  Muscle weakness (generalized)  Other symptoms and signs involving the musculoskeletal system  Abnormal posture  Rationale for Evaluation and Treatment: Rehabilitation  ONSET DATE: 08/22/22  SUBJECTIVE:                                                                                                                                                                                      SUBJECTIVE STATEMENT: Pt states MD has cleared him from using the sling. Pt states visit went well.   EVAL: Patient reports that he fell off a ladder ~ 7 weeks ago with injury to Rt shoulder. Underwent Rt RCR arthroscopically 08/22/22 with some continued bleeding following surgery. No longer bleeding (stopped Saturday 08/26/22). In abduction sling except for dressing.  Hand dominance: Right  PERTINENT HISTORY: Rt RCR ~ 8 years ago; Lt THA 5 yrs ago; lumbar surgery 15 yrs ago; history of chronic LBP; asthma; arthritis; sleep apnea  PAIN:  Are you having pain? Yes: NPRS scale:  3/10 Pain location: shoulder and biceps Pain description: dull aching  Aggravating factors: moving and getting in and out of the sling  Relieving factors: meds; ice  PRECAUTIONS: Shoulder- post op protocol  WEIGHT BEARING RESTRICTIONS: Yes no wt bearing Rt UE   FALLS:  Has patient fallen in last 6 months? Yes. 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   UPPER EXTREMITY ROM:   Active/Passive ROM Right Eval Passive  Sitting  Rtight  09/21/22 Passive  Supine  Left Eval AROM Standing   Shoulder flexion 75 deg 112 136  Shoulder extension   45  Shoulder abduction   140  Shoulder adduction     Shoulder internal rotation   Thumb &9 pain  Shoulder external rotation   90 shoulder at 90 deg, elbow 90 deg   Elbow flexion     Elbow extension     Wrist flexion     Wrist extension     Wrist ulnar deviation     Wrist radial deviation     Wrist pronation     Wrist supination     (Blank rows = not tested) Cervical ROM - tight end ranges greatest with Lt lateral flexion and Lt rotation   UPPER EXTREMITY MMT:  MMT Right eval Left eval  Shoulder flexion    Shoulder extension    Shoulder abduction    Shoulder adduction    Shoulder internal rotation    Shoulder external rotation    Middle trapezius    Lower trapezius    Elbow flexion    Elbow extension    Wrist flexion    Wrist extension    Wrist ulnar deviation    Wrist radial deviation    Wrist pronation    Wrist  supination    Grip strength (lbs)    (Blank rows = not tested)  PALPATION: muscular tightness Rt shoulder girdle     [facility] Adult PT Treatment:                                                  DATE: 09/26/22 Therapeutic Exercise: Pendulum 30 CW; 30 CCW S/L scapular clock in all directions x10 Chin tuck 10 sec x 5 w/noodle  Scap squeeze 10 sec x 10 w/ noodle Isometric shoulder abduction 5 sec x 5 Isometric ER 5 sec x 5 Isometric shoulder extension 5 sec x 5 Isometric shoulder IR 5 sec x 5  Isometric shoulder flexion 5 sec x 5 Manual Therapy: Soft tissue mobilization Rt shoulder girdle  PROM Rt shoulder flexion, scaption within tissue tolerance - pt supine  Joint mobs Rt GH joint manually and using pillow case for mobs  Modalities: Vaso Rt shoulder 34 deg low pressure  DATE: 09/21/22 Therapeutic Exercise: Pendulum 30 CW; 30 CCW  Chin tuck 10 sec x 5 w/noodle  Scap squeeze 10 sec x 10 w/ noodle  Active elbow, wrist, hand ROM AROM forearm supination w/ gentle stretch end range 2-3 reps 5 sec hold  Isometric shoulder abduction 5 sec x 5 Isometric ER 5 sec x 5 Isometric shoulder extension 5 sec x 5 Isometric shoulder IR 5 sec x 5  Lt sidelying scapular clock  Manual Therapy: Soft tissue mobilization Rt shoulder girdle  PROM Rt shoulder flexion, scaption within tissue tolerance - pt supine  Joint mobs Rt GH joint manually and using pillow case for mobs  Scar massage  Neuromuscular re-ed: Postural correction encouraging patient to  sit and stand with improved upright posture, posterior shoulder girdle engaged  Modalities: Vaso Rt shoulder 34 deg low pressure Self Care: (HEP) Myofacial ball release Rt shoulder girdle anterior/posterior Use of noodle for exercises and for sitting in recliner  Encouraged patient to try lying down on his bed a couple times/day Instructed in scar massage for home 2x/day ~ 5 min each scope site    PATIENT EDUCATION: Education details: POC; HEP   Person educated: Patient Education method: Explanation, Demonstration, Tactile cues, Verbal cues, and Handouts Education comprehension: verbalized understanding, returned demonstration, verbal cues required, tactile cues required, and needs further education  HOME EXERCISE PROGRAM: Access Code: [email or web address] URL: [email or web address] Date: 09/18/2022 Prepared by: Corlis Leak  Exercises - Circular Shoulder Pendulum with Table Support  - 3-4 x daily - 7 x weekly - 1 sets - 20-30 reps - Seated Cervical Retraction  - 3 x daily - 7 x weekly - 1 sets - 10 reps - Standing Scapular Retraction  - 3 x daily - 7 x weekly - 1 sets - 10 reps - 10 hold - Standing Infraspinatus/Teres Minor Release with Ball at Wall  - 2 x daily - 7 x weekly - Standing Pectoral Release with Shoulder Abduction with Ball at Wall  - 2-3 x daily - 7 x weekly - Seated Cervical Rotation AROM  - 2 x daily - 7 x weekly - 1 sets - 5 reps - 2-3 sec  hold - Seated Cervical Sidebending AROM  - 2 x daily - 7 x weekly - 1 sets - 5 reps - 5-10 sec  hold - Isometric Shoulder Abduction at Wall  - 2 x daily - 7 x weekly - 1 sets - 5-10 reps - 5 sec  hold - Isometric Shoulder External Rotation at Wall  - 2 x daily - 7 x weekly - 1 sets - 5 reps - 5 sec  hold - Isometric Shoulder Extension at Wall  - 2 x daily - 7 x weekly - 1 sets - 5-10 reps - 5 sec  hold - Standing Isometric Shoulder Internal Rotation with Towel Roll at Doorway  - 2 x daily - 7 x weekly - 1 sets - 5 reps - 5 sec  hold - Shoulder Scar Massage  - 2 x daily - 7 x weekly - 1 sets - 1 reps - 5 min  hold  ASSESSMENT:  CLINICAL IMPRESSION: Patient continues to work on HEP with in post op precautions. Continued with joint mobs and PROM to address joint tightness avoiding overstretch to tendons. Good tolerance of manual work and PROM in supine. Progressing well with shoulder rehab per protocol.   OBJECTIVE IMPAIRMENTS: s/p Rt RCR 08/22/22 following a fall ~ 6 weeks  before with injury to Rt shoulder. He had a previous Rt RCR ~ 8 years ago. Patient presents with Rt UE in abduction sling. He has limited functional ability with Rt UE; decreased ROM, strength, function Rt UE; continued edema Rt shoulder; poor posture and alignment; decreased scapular stability/strength.decreased activity tolerance, decreased mobility, decreased ROM, decreased strength, hypomobility, increased edema, increased fascial restrictions, impaired flexibility, impaired UE functional use, improper body mechanics, postural dysfunction, and pain.    GOALS: Goals reviewed with patient? Yes  SHORT TERM GOALS: Target date: 10/10/2022  Increase AROM Rt shoulder to 90 deg elevation Baseline: Goal status: INITIAL  2.  Independent in initial HEP  Baseline:  Goal status: INITIAL  3.  Review and progress with post op precautions, progressing as protocol  dictates  Baseline:  Goal status: INITIAL   LONG TERM GOALS: Target date: 11/21/2022  Improve posture and alignment with patient to demonstrate improved upright posture with posterior shoulder girdle engaged  Baseline:  Goal status: INITIAL  2.  Increase AROM Rt shoulder to WFL's in all planes  Baseline:  Goal status: INITIAL  3.  4/5 to 5/5 strength Rt shoulder  Baseline:  Goal status: INITIAL  4.  Patient reports ability to return to functional activities using Rt UE including dressing, grooming, yard work, ADL's  Baseline:  Goal status: INITIAL  5.  Independent HEP (including aquatic program as indicated) Baseline:  Goal status: INITIAL  6.  Improve functional limitation score to 54 Baseline: 4 Goal status: INITIAL  PLAN:  PT FREQUENCY: 2x/week  PT DURATION: 12 weeks  PLANNED INTERVENTIONS: Therapeutic exercises, Therapeutic activity, Neuromuscular re-education, Patient/Family education, Self Care, Joint mobilization, Aquatic Therapy, Dry Needling, Electrical stimulation, Cryotherapy, Moist heat, Taping,  Vasopneumatic device, Ultrasound, Ionotophoresis 4mg /ml Dexamethasone, Manual therapy, and Re-evaluation  PLAN FOR NEXT SESSION: review and progress exercises per protocol; manual work, DN, modalities as indicated; progressing exercises as indicated per protocol    Adoni Greenough April Ma L Annika Selke, PT 09/26/2022, 10:17 AM

## 2022-09-28 ENCOUNTER — Encounter: Payer: Self-pay | Admitting: Rehabilitative and Restorative Service Providers"

## 2022-09-28 ENCOUNTER — Ambulatory Visit: Payer: Medicare Other | Attending: Orthopaedic Surgery | Admitting: Rehabilitative and Restorative Service Providers"

## 2022-09-28 DIAGNOSIS — M25511 Pain in right shoulder: Secondary | ICD-10-CM | POA: Diagnosis present

## 2022-09-28 DIAGNOSIS — R29898 Other symptoms and signs involving the musculoskeletal system: Secondary | ICD-10-CM | POA: Insufficient documentation

## 2022-09-28 DIAGNOSIS — M6281 Muscle weakness (generalized): Secondary | ICD-10-CM | POA: Diagnosis present

## 2022-09-28 DIAGNOSIS — R293 Abnormal posture: Secondary | ICD-10-CM | POA: Diagnosis present

## 2022-09-28 DIAGNOSIS — M25611 Stiffness of right shoulder, not elsewhere classified: Secondary | ICD-10-CM

## 2022-09-28 NOTE — Therapy (Addendum)
OUTPATIENT PHYSICAL THERAPY SHOULDER TREATMENT  MEDICARE 10th VISIT NOTE   Progress Note Reporting Period 08/29/22 to 09/28/22  See note below for Objective Data and Assessment of Progress/Goals.     Patient Name: Johnny Arnold MRN: [id number] DOB:08-Oct-1955, 67 y.o., male Today's Date: 09/28/2022  END OF SESSION:  PT End of Session - 09/28/22 1019     Visit Number 10    Number of Visits 24    Date for PT Re-Evaluation 11/21/22    Authorization Type blue medicare $10 copay    Progress Note Due on Visit 10    PT Start Time 1015    PT Stop Time 1100    PT Time Calculation (min) 45 min              History reviewed. No pertinent past medical history. Past Surgical History:  Procedure Laterality Date   left total hip arthroplasty Left 06/03/2019   There are no problems to display for this patient.   PCP: Dr Lasandra Beech  REFERRING PROVIDER: Shon Baton, PA-C; Dr Theophilus Bones  REFERRING DIAG: Rt RCR   THERAPY DIAG:  Acute pain of right shoulder  Stiffness of right shoulder, not elsewhere classified  Muscle weakness (generalized)  Other symptoms and signs involving the musculoskeletal system  Abnormal posture  Rationale for Evaluation and Treatment: Rehabilitation  ONSET DATE: 08/22/22  SUBJECTIVE:                                                                                                                                                                                      SUBJECTIVE STATEMENT: Pt states MD has cleared him from using the sling. Pt states visit went well.   EVAL: Patient reports that he fell off a ladder ~ 7 weeks ago with injury to Rt shoulder. Underwent Rt RCR arthroscopically 08/22/22 with some continued bleeding following surgery. No longer bleeding (stopped Saturday 08/26/22). In abduction sling except for dressing.  Hand dominance: Right  PERTINENT HISTORY: Rt RCR ~ 8 years ago; Lt THA 5 yrs ago; lumbar surgery 15 yrs ago;  history of chronic LBP; asthma; arthritis; sleep apnea  PAIN:  Are you having pain? Yes: NPRS scale: 3/10 Pain location: shoulder and biceps Pain description: dull aching  Aggravating factors: moving and getting in and out of the sling  Relieving factors: meds; ice  PRECAUTIONS: Shoulder- post op protocol  WEIGHT BEARING RESTRICTIONS: Yes no wt bearing Rt UE   FALLS:  Has patient fallen in last 6 months? Yes. 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   UPPER EXTREMITY ROM:   Active/Passive ROM Right Eval Passive  Sitting  Rtight  09/21/22 Passive  Supine  Left Eval AROM Standing   Shoulder flexion 75 deg 112 136  Shoulder extension   45  Shoulder abduction   140  Shoulder adduction     Shoulder internal rotation   Thumb &9 pain  Shoulder external rotation   90 shoulder at 90 deg, elbow 90 deg   Elbow flexion     Elbow extension     Wrist flexion     Wrist extension     Wrist ulnar deviation     Wrist radial deviation     Wrist pronation     Wrist supination     (Blank rows = not tested) Cervical ROM - tight end ranges greatest with Lt lateral flexion and Lt rotation   UPPER EXTREMITY MMT:  MMT Right eval Left eval  Shoulder flexion    Shoulder extension    Shoulder abduction    Shoulder adduction    Shoulder internal rotation    Shoulder external rotation    Middle trapezius    Lower trapezius    Elbow flexion    Elbow extension    Wrist flexion     Wrist extension    Wrist ulnar deviation    Wrist radial deviation    Wrist pronation    Wrist supination    Grip strength (lbs)    (Blank rows = not tested)  PALPATION: muscular tightness Rt shoulder girdle     [facility] Adult PT Treatment:                                                   DATE: 09/28/22 Therapeutic Exercise: Pendulum 30 CW; 30 CCW S/L scapular clock in all directions x10 Chin tuck 10 sec x 5 w/noodle  Scap squeeze 10 sec x 10 w/ noodle Isometric shoulder abduction 5 sec x 5 Isometric ER 5 sec x 5 Isometric shoulder extension 5 sec x 5 Isometric shoulder IR 5 sec x 5  Isometric shoulder flexion 5 sec x 5 Shoulder flexion at counter step back 10 sec x 5  Pulley flexion 10 sec x 5 Supine T on noodle ~ 2 min UE's ~ 20-30 deg abd  Manual Therapy: Soft tissue mobilization Rt shoulder girdle  PROM Rt shoulder flexion, scaption within tissue tolerance - pt supine  Joint mobs Rt GH joint manually and using pillow case for mobs  Modalities: Vaso Rt shoulder 34 deg low pressure x 10 min  DATE: 09/26/22 Therapeutic Exercise: Pendulum 30 CW; 30 CCW S/L scapular clock in all directions x10 Chin tuck 10 sec x 5 w/noodle  Scap squeeze 10 sec x 10 w/ noodle Isometric shoulder abduction 5 sec x 5 Isometric ER 5 sec x 5 Isometric shoulder extension 5 sec x 5 Isometric shoulder IR 5 sec x 5  Isometric shoulder flexion 5 sec x 5 Manual Therapy: Soft tissue mobilization  Rt shoulder girdle  PROM Rt shoulder flexion, scaption within tissue tolerance - pt supine  Joint mobs Rt GH joint manually and using pillow case for mobs  Modalities: Vaso Rt shoulder 34 deg low pressure   PATIENT EDUCATION: Education details: POC; HEP  Person educated: Patient Education method: Programmer, multimedia, Facilities manager, Actor cues, Verbal cues, and Handouts Education comprehension: verbalized understanding, returned demonstration, verbal cues required, tactile cues required, and needs further  education  HOME EXERCISE PROGRAM: Access Code: [email or web address] URL: [email or web address] Date: 09/28/2022 Prepared by: Corlis Leak  Exercises - Circular Shoulder Pendulum with Table Support  - 3-4 x daily - 7 x weekly - 1 sets - 20-30 reps - Seated Cervical Retraction  - 3 x daily - 7 x weekly - 1 sets - 10 reps - Standing Scapular Retraction  - 3 x daily - 7 x weekly - 1 sets - 10 reps - 10 hold - Standing Infraspinatus/Teres Minor Release with Ball at Wall  - 2 x daily - 7 x weekly - Standing Pectoral Release with Shoulder Abduction with Ball at Wall  - 2-3 x daily - 7 x weekly - Seated Cervical Rotation AROM  - 2 x daily - 7 x weekly - 1 sets - 5 reps - 2-3 sec  hold - Seated Cervical Sidebending AROM  - 2 x daily - 7 x weekly - 1 sets - 5 reps - 5-10 sec  hold - Isometric Shoulder Abduction at Wall  - 2 x daily - 7 x weekly - 1 sets - 5-10 reps - 5 sec  hold - Isometric Shoulder External Rotation at Wall  - 2 x daily - 7 x weekly - 1 sets - 5 reps - 5 sec  hold - Isometric Shoulder Extension at Wall  - 2 x daily - 7 x weekly - 1 sets - 5-10 reps - 5 sec  hold - Standing Isometric Shoulder Internal Rotation with Towel Roll at Doorway  - 2 x daily - 7 x weekly - 1 sets - 5 reps - 5 sec  hold - Shoulder Scar Massage  - 2 x daily - 7 x weekly - 1 sets - 1 reps - 5 min  hold - Standing Shoulder and Trunk Flexion at Table  - 2 x daily - 7 x weekly - 1 sets - 5 reps - 5-10 sec  hold - Supine Chest Stretch on Foam Roll  - 2 x daily - 7 x weekly - 1 sets - 1 reps - 2-5 min  sec  hold - Seated Shoulder Flexion AAROM with Pulley Behind  - 2 x daily - 7 x weekly - 1 sets - 10 reps - 10 sec  hold  ASSESSMENT:  CLINICAL IMPRESSION: Progressing gradually with shoulder rehab per protocol. Patient is working on HEP within post op precautions. Continued with joint mobs and PROM to address joint tightness avoiding overstretch to tendons. Good tolerance of manual work and PROM in supine.     OBJECTIVE IMPAIRMENTS: s/p Rt RCR 08/22/22 following a fall ~ 6 weeks before with injury to Rt shoulder. He had a previous Rt RCR ~ 8 years ago. Patient presents with Rt UE in abduction sling. He has limited functional ability with Rt UE; decreased ROM, strength, function Rt UE; continued edema Rt shoulder; poor posture and alignment; decreased scapular stability/strength.decreased activity tolerance, decreased mobility, decreased ROM, decreased strength, hypomobility, increased edema, increased fascial restrictions, impaired flexibility, impaired UE functional use, improper body mechanics, postural dysfunction, and pain.  GOALS: Goals reviewed with patient? Yes  SHORT TERM GOALS: Target date: 10/10/2022  Increase AROM Rt shoulder to 90 deg elevation Baseline: Goal status: INITIAL  2.  Independent in initial HEP  Baseline:  Goal status: INITIAL  3.  Review and progress with post op precautions, progressing as protocol dictates  Baseline:  Goal status: INITIAL   LONG TERM GOALS: Target date: 11/21/2022  Improve posture and alignment with patient to demonstrate improved upright posture with posterior shoulder girdle engaged  Baseline:  Goal status: INITIAL  2.  Increase AROM Rt shoulder to WFL's in all planes  Baseline:  Goal status: INITIAL  3.  4/5 to 5/5 strength Rt shoulder  Baseline:  Goal status: INITIAL  4.  Patient reports ability to return to functional activities using Rt UE including dressing, grooming, yard work, ADL's  Baseline:  Goal status: INITIAL  5.  Independent HEP (including aquatic program as indicated) Baseline:  Goal status: INITIAL  6.  Improve functional limitation score to 54 Baseline: 4 Goal status: INITIAL  PLAN:  PT FREQUENCY: 2x/week  PT DURATION: 12 weeks  PLANNED INTERVENTIONS: Therapeutic exercises, Therapeutic activity, Neuromuscular re-education, Patient/Family education, Self Care, Joint mobilization, Aquatic Therapy, Dry  Needling, Electrical stimulation, Cryotherapy, Moist heat, Taping, Vasopneumatic device, Ultrasound, Ionotophoresis 4mg /ml Dexamethasone, Manual therapy, and Re-evaluation  PLAN FOR NEXT SESSION: review and progress exercises per protocol; manual work, DN, modalities as indicated; progressing exercises as indicated per protocol    Val Riles, PT 09/28/2022, 10:19 AM

## 2022-10-03 ENCOUNTER — Encounter: Payer: Self-pay | Admitting: Physical Therapy

## 2022-10-03 ENCOUNTER — Ambulatory Visit: Payer: Medicare Other | Admitting: Physical Therapy

## 2022-10-03 DIAGNOSIS — R293 Abnormal posture: Secondary | ICD-10-CM

## 2022-10-03 DIAGNOSIS — M25611 Stiffness of right shoulder, not elsewhere classified: Secondary | ICD-10-CM

## 2022-10-03 DIAGNOSIS — M25511 Pain in right shoulder: Secondary | ICD-10-CM

## 2022-10-03 DIAGNOSIS — R29898 Other symptoms and signs involving the musculoskeletal system: Secondary | ICD-10-CM

## 2022-10-03 DIAGNOSIS — M6281 Muscle weakness (generalized): Secondary | ICD-10-CM

## 2022-10-03 NOTE — Therapy (Signed)
OUTPATIENT PHYSICAL THERAPY SHOULDER TREATMENT   Patient Name: Johnny Arnold MRN: [id number] DOB:1955/10/10, 67 y.o., male Today's Date: 10/03/2022  END OF SESSION:  PT End of Session - 10/03/22 1014     Visit Number 11    Number of Visits 24    Date for PT Re-Evaluation 11/21/22    Authorization Type blue medicare $10 copay    Progress Note Due on Visit 10    PT Start Time 1015    PT Stop Time 1100    PT Time Calculation (min) 45 min    Activity Tolerance Patient tolerated treatment well               History reviewed. No pertinent past medical history. Past Surgical History:  Procedure Laterality Date   left total hip arthroplasty Left 06/03/2019   There are no problems to display for this patient.   PCP: Dr Lasandra Beech  REFERRING PROVIDER: Shon Baton, PA-C; Dr Theophilus Bones  REFERRING DIAG: Rt RCR   THERAPY DIAG:  Acute pain of right shoulder  Stiffness of right shoulder, not elsewhere classified  Muscle weakness (generalized)  Other symptoms and signs involving the musculoskeletal system  Abnormal posture  Rationale for Evaluation and Treatment: Rehabilitation  ONSET DATE: 08/22/22  SUBJECTIVE:                                                                                                                                                                                      SUBJECTIVE STATEMENT: Pt notes no new complaints. States he rested his R UE on the handles of the elliptical and had no pain.   EVAL: Patient reports that he fell off a ladder ~ 7 weeks ago with injury to Rt shoulder. Underwent Rt RCR arthroscopically 08/22/22 with some continued bleeding following surgery. No longer bleeding (stopped Saturday 08/26/22). In abduction sling except for dressing.  Hand dominance: Right  PERTINENT HISTORY: Rt RCR ~ 8 years ago; Lt THA 5 yrs ago; lumbar surgery 15 yrs ago; history of chronic LBP; asthma; arthritis; sleep apnea  PAIN:  Are you  having pain? Yes: NPRS scale: 3/10 Pain location: shoulder and biceps Pain description: dull aching  Aggravating factors: moving and getting in and out of the sling  Relieving factors: meds; ice  PRECAUTIONS: Shoulder- post op protocol  WEIGHT BEARING RESTRICTIONS: Yes no wt bearing Rt UE   FALLS:  Has patient fallen in last 6 months? Yes. 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   UPPER EXTREMITY ROM:   Active/Passive ROM Right Eval Passive  Sitting  Rtight  09/21/22 Passive  Supine  Left Eval AROM Standing   Shoulder flexion 75 deg 112 136  Shoulder extension   45  Shoulder abduction   140  Shoulder adduction     Shoulder internal rotation   Thumb &9 pain  Shoulder external rotation   90 shoulder at 90 deg, elbow 90 deg   Elbow flexion     Elbow extension     Wrist flexion     Wrist extension     Wrist ulnar deviation     Wrist radial deviation     Wrist pronation     Wrist supination     (Blank rows = not tested) Cervical ROM - tight end ranges greatest with Lt lateral flexion and Lt rotation   UPPER EXTREMITY MMT:  MMT Right eval Left eval  Shoulder flexion    Shoulder extension    Shoulder abduction    Shoulder adduction    Shoulder internal rotation    Shoulder external rotation    Middle trapezius    Lower trapezius    Elbow flexion    Elbow extension    Wrist flexion    Wrist extension    Wrist ulnar deviation    Wrist radial deviation     Wrist pronation    Wrist supination    Grip strength (lbs)    (Blank rows = not tested)  PALPATION: muscular tightness Rt shoulder girdle     [facility] Adult PT Treatment:                                                 DATE: 10/03/22 Therapeutic Exercise: Pulleys: flexion x 1 min, scaption x 1 min Pendulum 30 CW; 30 CCW Supine scap squeeze 10x10 sec Shoulder press dowel x10 Shoulder flexion AAROM dowel 2x10 Shoulder scaption AAROM dowel 2x10 Shoulder ER AAROM dowel 2x10 S/L scapular clock 2x10 Finger wall crawl flexion x5 Manual Therapy: Soft tissue mobilization R pec PROM Rt shoulder flexion, scaption within tissue tolerance - pt supine  Joint mobs Rt GH joint manually and using pillow case for mobs  Skilled assessment and palpation for TPDN Trigger Point Dry-Needling  Treatment instructions: Expect mild to moderate muscle soreness. S/S of pneumothorax if dry needled over a lung field, and to seek immediate medical attention should they occur. Patient verbalized understanding of these instructions and education.  Patient Consent Given: Yes Education handout provided: Yes Muscles treated: Pecs Electrical stimulation performed: No Parameters: N/A Treatment response/outcome: twitch response, increased muscle length Modalities: Vaso Rt shoulder 34 deg low pressure x10 min  DATE: 09/28/22 Therapeutic Exercise: Pendulum 30 CW; 30 CCW S/L scapular clock in all directions x10 Chin tuck 10 sec x 5 w/noodle  Scap squeeze 10 sec x 10 w/ noodle Isometric shoulder abduction 5 sec x 5 Isometric ER 5 sec x 5 Isometric shoulder extension 5 sec x 5 Isometric shoulder IR 5 sec x 5  Isometric shoulder flexion 5 sec x  5 Shoulder flexion at counter step back 10 sec x 5  Pulley flexion 10 sec x 5 Supine T on noodle ~ 2 min UE's ~ 20-30 deg abd  Manual Therapy: Soft tissue mobilization Rt shoulder girdle  PROM Rt shoulder flexion, scaption within tissue tolerance - pt supine  Joint mobs Rt  GH joint manually and using pillow case for mobs  Modalities: Vaso Rt shoulder 34 deg low pressure x 10 min  DATE: 09/26/22 Therapeutic Exercise: Pendulum 30 CW; 30 CCW S/L scapular clock in all directions x10 Chin tuck 10 sec x 5 w/noodle  Scap squeeze 10 sec x 10 w/ noodle Isometric shoulder abduction 5 sec x 5 Isometric ER 5 sec x 5 Isometric shoulder extension 5 sec x 5 Isometric shoulder IR 5 sec x 5  Isometric shoulder flexion 5 sec x 5 Manual Therapy: Soft tissue mobilization Rt shoulder girdle  PROM Rt shoulder flexion, scaption within tissue tolerance - pt supine  Joint mobs Rt GH joint manually and using pillow case for mobs  Modalities: Vaso Rt shoulder 34 deg low pressure   PATIENT EDUCATION: Education details: POC; HEP  Person educated: Patient Education method: Programmer, multimedia, Facilities manager, Actor cues, Verbal cues, and Handouts Education comprehension: verbalized understanding, returned demonstration, verbal cues required, tactile cues required, and needs further education  HOME EXERCISE PROGRAM: Access Code: [email or web address] URL: [email or web address] Date: 09/28/2022 Prepared by: Corlis Leak  Exercises - Circular Shoulder Pendulum with Table Support  - 3-4 x daily - 7 x weekly - 1 sets - 20-30 reps - Seated Cervical Retraction  - 3 x daily - 7 x weekly - 1 sets - 10 reps - Standing Scapular Retraction  - 3 x daily - 7 x weekly - 1 sets - 10 reps - 10 hold - Standing Infraspinatus/Teres Minor Release with Ball at Wall  - 2 x daily - 7 x weekly - Standing Pectoral Release with Shoulder Abduction with Ball at Wall  - 2-3 x daily - 7 x weekly - Seated Cervical Rotation AROM  - 2 x daily - 7 x weekly - 1 sets - 5 reps - 2-3 sec  hold - Seated Cervical Sidebending AROM  - 2 x daily - 7 x weekly - 1 sets - 5 reps - 5-10 sec  hold - Isometric Shoulder Abduction at Wall  - 2 x daily - 7 x weekly - 1 sets - 5-10 reps - 5 sec  hold - Isometric Shoulder External  Rotation at Wall  - 2 x daily - 7 x weekly - 1 sets - 5 reps - 5 sec  hold - Isometric Shoulder Extension at Wall  - 2 x daily - 7 x weekly - 1 sets - 5-10 reps - 5 sec  hold - Standing Isometric Shoulder Internal Rotation with Towel Roll at Doorway  - 2 x daily - 7 x weekly - 1 sets - 5 reps - 5 sec  hold - Shoulder Scar Massage  - 2 x daily - 7 x weekly - 1 sets - 1 reps - 5 min  hold - Standing Shoulder and Trunk Flexion at Table  - 2 x daily - 7 x weekly - 1 sets - 5 reps - 5-10 sec  hold - Supine Chest Stretch on Foam Roll  - 2 x daily - 7 x weekly - 1 sets - 1 reps - 2-5 min  sec  hold - Seated Shoulder Flexion AAROM with Pulley Behind  - 2 x daily - 7  x weekly - 1 sets - 10 reps - 10 sec  hold  ASSESSMENT:  CLINICAL IMPRESSION: Pt is now 6 weeks post op. Cleared to initiate AAROM. Worked on using dowel this session to progress shoulder elevation. Performed trial of TPDN for pecs. Pt tolerated well.   OBJECTIVE IMPAIRMENTS: s/p Rt RCR 08/22/22 following a fall ~ 6 weeks before with injury to Rt shoulder. He had a previous Rt RCR ~ 8 years ago. Patient presents with Rt UE in abduction sling. He has limited functional ability with Rt UE; decreased ROM, strength, function Rt UE; continued edema Rt shoulder; poor posture and alignment; decreased scapular stability/strength.decreased activity tolerance, decreased mobility, decreased ROM, decreased strength, hypomobility, increased edema, increased fascial restrictions, impaired flexibility, impaired UE functional use, improper body mechanics, postural dysfunction, and pain.    GOALS: Goals reviewed with patient? Yes  SHORT TERM GOALS: Target date: 10/10/2022  Increase AROM Rt shoulder to 90 deg elevation Baseline: Goal status: INITIAL  2.  Independent in initial HEP  Baseline:  Goal status: INITIAL  3.  Review and progress with post op precautions, progressing as protocol dictates  Baseline:  Goal status: INITIAL   LONG TERM GOALS:  Target date: 11/21/2022  Improve posture and alignment with patient to demonstrate improved upright posture with posterior shoulder girdle engaged  Baseline:  Goal status: INITIAL  2.  Increase AROM Rt shoulder to WFL's in all planes  Baseline:  Goal status: INITIAL  3.  4/5 to 5/5 strength Rt shoulder  Baseline:  Goal status: INITIAL  4.  Patient reports ability to return to functional activities using Rt UE including dressing, grooming, yard work, ADL's  Baseline:  Goal status: INITIAL  5.  Independent HEP (including aquatic program as indicated) Baseline:  Goal status: INITIAL  6.  Improve functional limitation score to 54 Baseline: 4 Goal status: INITIAL  PLAN:  PT FREQUENCY: 2x/week  PT DURATION: 12 weeks  PLANNED INTERVENTIONS: Therapeutic exercises, Therapeutic activity, Neuromuscular re-education, Patient/Family education, Self Care, Joint mobilization, Aquatic Therapy, Dry Needling, Electrical stimulation, Cryotherapy, Moist heat, Taping, Vasopneumatic device, Ultrasound, Ionotophoresis 4mg /ml Dexamethasone, Manual therapy, and Re-evaluation  PLAN FOR NEXT SESSION: review and progress exercises per protocol; manual work, DN, modalities as indicated; progressing exercises as indicated per protocol    Vishwa Dais April Ma L Kamsiyochukwu Buist, PT 10/03/2022, 10:15 AM

## 2022-10-05 ENCOUNTER — Encounter: Payer: Self-pay | Admitting: Rehabilitative and Restorative Service Providers"

## 2022-10-05 ENCOUNTER — Ambulatory Visit: Payer: Medicare Other | Admitting: Rehabilitative and Restorative Service Providers"

## 2022-10-05 DIAGNOSIS — R29898 Other symptoms and signs involving the musculoskeletal system: Secondary | ICD-10-CM

## 2022-10-05 DIAGNOSIS — R293 Abnormal posture: Secondary | ICD-10-CM

## 2022-10-05 DIAGNOSIS — M25611 Stiffness of right shoulder, not elsewhere classified: Secondary | ICD-10-CM

## 2022-10-05 DIAGNOSIS — M25511 Pain in right shoulder: Secondary | ICD-10-CM | POA: Diagnosis not present

## 2022-10-05 DIAGNOSIS — M6281 Muscle weakness (generalized): Secondary | ICD-10-CM

## 2022-10-05 NOTE — Therapy (Signed)
OUTPATIENT PHYSICAL THERAPY SHOULDER TREATMENT   Patient Name: Johnny Arnold MRN: [id number] DOB:[birth year], 67 y.o., male Today's Date: 10/05/2022  END OF SESSION:  PT End of Session - 10/05/22 1021     Visit Number 12    Number of Visits 24    Date for PT Re-Evaluation 11/21/22    Authorization Type blue medicare $10 copay    Progress Note Due on Visit 20    PT Start Time 1015    PT Stop Time 1103    PT Time Calculation (min) 48 min    Activity Tolerance Patient tolerated treatment well               History reviewed. No pertinent past medical history. Past Surgical History:  Procedure Laterality Date   left total hip arthroplasty Left 06/03/2019   There are no problems to display for this patient.   PCP: Dr Lasandra Beech  REFERRING PROVIDER: Shon Baton, PA-C; Dr Theophilus Bones  REFERRING DIAG: Rt RCR   THERAPY DIAG:  Acute pain of right shoulder  Stiffness of right shoulder, not elsewhere classified  Muscle weakness (generalized)  Other symptoms and signs involving the musculoskeletal system  Abnormal posture  Rationale for Evaluation and Treatment: Rehabilitation  ONSET DATE: 08/22/22  SUBJECTIVE:                                                                                                                                                                                      SUBJECTIVE STATEMENT: Patient feels he is healing faster than the doctor expected. Working on exercises at home. Can't tell a difference with the dry needling. Manual work does feel good to relax the tightness in the Rt shoulder.   EVAL: Patient reports that he fell off a ladder ~ 7 weeks ago with injury to Rt shoulder. Underwent Rt RCR arthroscopically 08/22/22 with some continued bleeding following surgery. No longer bleeding (stopped Saturday 08/26/22). In abduction sling except for dressing.  Hand dominance: Right  PERTINENT HISTORY: Rt RCR ~ 8 years ago; Lt THA 5 yrs ago;  lumbar surgery 15 yrs ago; history of chronic LBP; asthma; arthritis; sleep apnea  PAIN:  Are you having pain? Yes: NPRS scale: 2-3/10 Pain location: shoulder and biceps Pain description: dull aching  Aggravating factors: moving and getting in and out of the sling  Relieving factors: meds; ice  PRECAUTIONS: Shoulder- post op protocol  WEIGHT BEARING RESTRICTIONS: Yes no wt bearing Rt UE   FALLS:  Has patient fallen in last 6 months? Yes. 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   UPPER EXTREMITY ROM:   Active/Passive ROM Right Eval Passive  Sitting  Rtight  09/21/22 Passive  Supine  Left Eval AROM Standing   Shoulder flexion 75 deg 112 136  Shoulder extension   45  Shoulder abduction   140  Shoulder adduction     Shoulder internal rotation   Thumb &9 pain  Shoulder external rotation   90 shoulder at 90 deg, elbow 90 deg   Elbow flexion     Elbow extension     Wrist flexion     Wrist extension     Wrist ulnar deviation     Wrist radial deviation     Wrist pronation     Wrist supination     (Blank rows = not tested) Cervical ROM - tight end ranges greatest with Lt lateral flexion and Lt rotation   UPPER EXTREMITY MMT:  MMT Right eval Left eval  Shoulder flexion    Shoulder extension    Shoulder abduction    Shoulder adduction    Shoulder internal rotation    Shoulder external rotation    Middle trapezius    Lower trapezius    Elbow flexion    Elbow  extension    Wrist flexion    Wrist extension    Wrist ulnar deviation    Wrist radial deviation    Wrist pronation    Wrist supination    Grip strength (lbs)    (Blank rows = not tested)  PALPATION: muscular tightness Rt shoulder girdle     [facility] Adult PT Treatment:                                                 DATE: 10/05/22 Therapeutic Exercise: Pulleys: flexion x 10 sec x 10  Pulley scaption x 10 sec x 10  Pulley horizontal ab/add 90/90 x 10  Pendulum 30 CW; 30 CCW Isometric row step back red TB 5 sec x 10  Isometric ER step out red TB 5 sec x 10  Isometric IR step out red TB 5 sec x 10  Sitting w/coregeous ball mid scapula 10 sec scap squeeze x 10    Manual Therapy: Soft tissue mobilization Rt upper quarter PROM Rt shoulder flexion, scaption within tissue tolerance - pt supine  Joint mobs Rt GH joint manually and using pillow case for mobs  Modalities:  Vaso Rt shoulder 34 deg low pressure x10 min  DATE: 10/03/22 Therapeutic Exercise: Pulleys: flexion x 1 min, scaption x 1 min Pendulum 30 CW; 30 CCW Supine scap squeeze 10x10 sec Shoulder press dowel x10 Shoulder flexion AAROM dowel 2x10 Shoulder scaption AAROM dowel 2x10 Shoulder ER AAROM dowel 2x10 S/L scapular clock 2x10 Finger wall crawl flexion x5 Manual Therapy: Soft tissue mobilization R pec PROM Rt shoulder flexion, scaption within tissue tolerance - pt supine  Joint mobs Rt GH joint manually and using pillow case for mobs  Skilled assessment and palpation for  TPDN Trigger Point Dry-Needling  Treatment instructions: Expect mild to moderate muscle soreness. S/S of pneumothorax if dry needled over a lung field, and to seek immediate medical attention should they occur. Patient verbalized understanding of these instructions and education.  Patient Consent Given: Yes Education handout provided: Yes Muscles treated: Pecs Electrical stimulation performed: No Parameters: N/A Treatment response/outcome:  twitch response, increased muscle length Modalities: Vaso Rt shoulder 34 deg low pressure x10 min  DATE: 09/28/22 Therapeutic Exercise: Pendulum 30 CW; 30 CCW S/L scapular clock in all directions x10 Chin tuck 10 sec x 5 w/noodle  Scap squeeze 10 sec x 10 w/ noodle Isometric shoulder abduction 5 sec x 5 Isometric ER 5 sec x 5 Isometric shoulder extension 5 sec x 5 Isometric shoulder IR 5 sec x 5  Isometric shoulder flexion 5 sec x 5 Shoulder flexion at counter step back 10 sec x 5  Pulley flexion 10 sec x 5 Supine T on noodle ~ 2 min UE's ~ 20-30 deg abd  Manual Therapy: Soft tissue mobilization Rt shoulder girdle  PROM Rt shoulder flexion, scaption within tissue tolerance - pt supine  Joint mobs Rt GH joint manually and using pillow case for mobs  Modalities: Vaso Rt shoulder 34 deg low pressure x 10 min  DATE: 09/26/22 Therapeutic Exercise: Pendulum 30 CW; 30 CCW S/L scapular clock in all directions x10 Chin tuck 10 sec x 5 w/noodle  Scap squeeze 10 sec x 10 w/ noodle Isometric shoulder abduction 5 sec x 5 Isometric ER 5 sec x 5 Isometric shoulder extension 5 sec x 5 Isometric shoulder IR 5 sec x 5  Isometric shoulder flexion 5 sec x 5 Manual Therapy: Soft tissue mobilization Rt shoulder girdle  PROM Rt shoulder flexion, scaption within tissue tolerance - pt supine  Joint mobs Rt GH joint manually and using pillow case for mobs  Modalities: Vaso Rt shoulder 34 deg low pressure   PATIENT EDUCATION: Education details: POC; HEP  Person educated: Patient Education method: Programmer, multimedia, Facilities manager, Actor cues, Verbal cues, and Handouts Education comprehension: verbalized understanding, returned demonstration, verbal cues required, tactile cues required, and needs further education  HOME EXERCISE PROGRAM: Access Code: [email or web address] URL: [email or web address] Date: 10/05/2022 Prepared by: Corlis Leak  Exercises - Circular Shoulder Pendulum with Table  Support  - 3-4 x daily - 7 x weekly - 1 sets - 20-30 reps - Seated Cervical Retraction  - 3 x daily - 7 x weekly - 1 sets - 10 reps - Standing Scapular Retraction  - 3 x daily - 7 x weekly - 1 sets - 10 reps - 10 hold - Standing Infraspinatus/Teres Minor Release with Ball at Wall  - 2 x daily - 7 x weekly - Standing Pectoral Release with Shoulder Abduction with Ball at Wall  - 2-3 x daily - 7 x weekly - Seated Cervical Rotation AROM  - 2 x daily - 7 x weekly - 1 sets - 5 reps - 2-3 sec  hold - Seated Cervical Sidebending AROM  - 2 x daily - 7 x weekly - 1 sets - 5 reps - 5-10 sec  hold - Isometric Shoulder Abduction at Wall  - 2 x daily - 7 x weekly - 1 sets - 5-10 reps - 5 sec  hold - Isometric Shoulder External Rotation at Wall  - 2 x daily - 7 x weekly - 1 sets - 5 reps - 5 sec  hold - Isometric Shoulder Extension at Wall  - 2 x daily -  7 x weekly - 1 sets - 5-10 reps - 5 sec  hold - Standing Isometric Shoulder Internal Rotation with Towel Roll at Doorway  - 2 x daily - 7 x weekly - 1 sets - 5 reps - 5 sec  hold - Shoulder Scar Massage  - 2 x daily - 7 x weekly - 1 sets - 1 reps - 5 min  hold - Standing Shoulder and Trunk Flexion at Table  - 2 x daily - 7 x weekly - 1 sets - 5 reps - 5-10 sec  hold - Supine Chest Stretch on Foam Roll  - 2 x daily - 7 x weekly - 1 sets - 1 reps - 2-5 min  sec  hold - Seated Shoulder Flexion AAROM with Pulley Behind  - 2 x daily - 7 x weekly - 1 sets - 10 reps - 10 sec  hold - Seated Shoulder Scaption AAROM with Pulley at Side  - 2 x daily - 7 x weekly - 1 sets - 10 reps - 10sec  hold - Standing Shoulder Row Reactive Isometric  - 2 x daily - 7 x weekly - 1 sets - 10 reps - 30-45 sec  hold - Shoulder External Rotation Reactive Isometrics  - 1 x daily - 7 x weekly - 1 sets - 10 reps - 3-5 sec  hold - Shoulder Internal Rotation Reactive Isometrics  - 2 x daily - 7 x weekly - 1 sets - 10 reps - 3-5 sec  hold  ASSESSMENT:  CLINICAL IMPRESSION: Progressing well  with shoulder rehab. Added AAROM with eccentric lowering from flexion with pulley. Added isometric scapular stabilization with TB. Continue with exercises, manual work, PROM to [employer].     OBJECTIVE IMPAIRMENTS: s/p Rt RCR 08/22/22 following a fall ~ 6 weeks before with injury to Rt shoulder. He had a previous Rt RCR ~ 8 years ago. Patient presents with Rt UE in abduction sling. He has limited functional ability with Rt UE; decreased ROM, strength, function Rt UE; continued edema Rt shoulder; poor posture and alignment; decreased scapular stability/strength.decreased activity tolerance, decreased mobility, decreased ROM, decreased strength, hypomobility, increased edema, increased fascial restrictions, impaired flexibility, impaired UE functional use, improper body mechanics, postural dysfunction, and pain.    GOALS: Goals reviewed with patient? Yes  SHORT TERM GOALS: Target date: 10/10/2022  Increase AROM Rt shoulder to 90 deg elevation Baseline: Goal status: INITIAL  2.  Independent in initial HEP  Baseline:  Goal status: INITIAL  3.  Review and progress with post op precautions, progressing as protocol dictates  Baseline:  Goal status: INITIAL   LONG TERM GOALS: Target date: 11/21/2022  Improve posture and alignment with patient to demonstrate improved upright posture with posterior shoulder girdle engaged  Baseline:  Goal status: INITIAL  2.  Increase AROM Rt shoulder to WFL's in all planes  Baseline:  Goal status: INITIAL  3.  4/5 to 5/5 strength Rt shoulder  Baseline:  Goal status: INITIAL  4.  Patient reports ability to return to functional activities using Rt UE including dressing, grooming, yard work, ADL's  Baseline:  Goal status: INITIAL  5.  Independent HEP (including aquatic program as indicated) Baseline:  Goal status: INITIAL  6.  Improve functional limitation score to 54 Baseline: 4 Goal status: INITIAL  PLAN:  PT FREQUENCY: 2x/week  PT DURATION: 12  weeks  PLANNED INTERVENTIONS: Therapeutic exercises, Therapeutic activity, Neuromuscular re-education, Patient/Family education, Self Care, Joint mobilization, Aquatic Therapy, Dry Needling, Electrical stimulation,  Cryotherapy, Moist heat, Taping, Vasopneumatic device, Ultrasound, Ionotophoresis 4mg /ml Dexamethasone, Manual therapy, and Re-evaluation  PLAN FOR NEXT SESSION: review and progress exercises per protocol; manual work, DN, modalities as indicated; progressing exercises as indicated per protocol    Val Riles, PT 10/05/2022, 10:27 AM

## 2022-10-10 ENCOUNTER — Ambulatory Visit: Payer: Medicare Other | Admitting: Physical Therapy

## 2022-10-10 ENCOUNTER — Encounter: Payer: Self-pay | Admitting: Physical Therapy

## 2022-10-10 DIAGNOSIS — M25511 Pain in right shoulder: Secondary | ICD-10-CM | POA: Diagnosis not present

## 2022-10-10 DIAGNOSIS — M25611 Stiffness of right shoulder, not elsewhere classified: Secondary | ICD-10-CM

## 2022-10-10 DIAGNOSIS — M6281 Muscle weakness (generalized): Secondary | ICD-10-CM

## 2022-10-10 DIAGNOSIS — R293 Abnormal posture: Secondary | ICD-10-CM

## 2022-10-10 DIAGNOSIS — R29898 Other symptoms and signs involving the musculoskeletal system: Secondary | ICD-10-CM

## 2022-10-10 NOTE — Therapy (Signed)
OUTPATIENT PHYSICAL THERAPY SHOULDER TREATMENT   Patient Name: Johnny Arnold MRN: [id number] DOB:November 04, 1955, 67 y.o., male Today's Date: 10/10/2022  END OF SESSION:  PT End of Session - 10/10/22 1008     Visit Number 13    Number of Visits 24    Date for PT Re-Evaluation 11/21/22    Authorization Type blue medicare $10 copay    Progress Note Due on Visit 20    PT Start Time 1010    PT Stop Time 1050    PT Time Calculation (min) 40 min    Activity Tolerance Patient tolerated treatment well             History reviewed. No pertinent past medical history. Past Surgical History:  Procedure Laterality Date   left total hip arthroplasty Left 06/03/2019   There are no problems to display for this patient.   PCP: Dr Lasandra Beech  REFERRING PROVIDER: Shon Baton, PA-C; Dr Theophilus Bones  REFERRING DIAG: Rt RCR   THERAPY DIAG:  Acute pain of right shoulder  Stiffness of right shoulder, not elsewhere classified  Muscle weakness (generalized)  Other symptoms and signs involving the musculoskeletal system  Abnormal posture  Rationale for Evaluation and Treatment: Rehabilitation  ONSET DATE: 08/22/22  SUBJECTIVE:                                                                                                                                                                                      SUBJECTIVE STATEMENT: Pt states pain has not had a problem with pain. Pt reports concern that he is still not able to elevate his shoulder.   EVAL: Patient reports that he fell off a ladder ~ 7 weeks ago with injury to Rt shoulder. Underwent Rt RCR arthroscopically 08/22/22 with some continued bleeding following surgery. No longer bleeding (stopped Saturday 08/26/22). In abduction sling except for dressing.  Hand dominance: Right  PERTINENT HISTORY: Rt RCR ~ 8 years ago; Lt THA 5 yrs ago; lumbar surgery 15 yrs ago; history of chronic LBP; asthma; arthritis; sleep apnea  PAIN:   Are you having pain? Yes: NPRS scale: 2-3/10 Pain location: shoulder and biceps Pain description: dull aching  Aggravating factors: moving and getting in and out of the sling  Relieving factors: meds; ice  PRECAUTIONS: Shoulder- post op protocol  WEIGHT BEARING RESTRICTIONS: Yes no wt bearing Rt UE   FALLS:  Has patient fallen in last 6 months? Yes. 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   UPPER EXTREMITY ROM:   Active/Passive ROM Right Eval Passive  Sitting  Rtight  09/21/22 Passive  Supine  Left Eval AROM Standing   Shoulder flexion 75 deg 112 136  Shoulder extension   45  Shoulder abduction   140  Shoulder adduction     Shoulder internal rotation   Thumb &9 pain  Shoulder external rotation   90 shoulder at 90 deg, elbow 90 deg   Elbow flexion     Elbow extension     Wrist flexion     Wrist extension     Wrist ulnar deviation     Wrist radial deviation     Wrist pronation     Wrist supination     (Blank rows = not tested) Cervical ROM - tight end ranges greatest with Lt lateral flexion and Lt rotation   UPPER EXTREMITY MMT:  MMT Right eval Left eval  Shoulder flexion    Shoulder extension    Shoulder abduction    Shoulder adduction    Shoulder internal rotation    Shoulder external rotation    Middle trapezius    Lower trapezius    Elbow flexion    Elbow extension    Wrist flexion    Wrist extension    Wrist ulnar deviation    Wrist radial  deviation    Wrist pronation    Wrist supination    Grip strength (lbs)    (Blank rows = not tested)  PALPATION: muscular tightness Rt shoulder girdle    [facility] Adult PT Treatment:                                                DATE: 10/10/22 Therapeutic Exercise: Pulleys: flexion x 1 min, scaption x 1 min Supine Shoulder flexion AAROM with dowel x15 Shoulder ER AAROM with dowel 2x10 Shoulder ER AAROM at 60 deg abd with dowel 2x10 Shoulder scaption AAROM with dowel 2x10 Seated Shoulder flexion AAROM on table x10 Shoulder scaption AAROM on table x10 Shoulder horizontal abd AAROM on table x10 Scap squeeze 2x10 Scapular clock x10 Manual Therapy: PROM Rt shoulder flexion, scaption within tissue tolerance - pt supine  Joint mobs Rt GH joint manually  Modalities: Vaso R shoulder 34 deg, low pressure, 10 min     [facility] Adult PT Treatment:                                                DATE: 10/05/22 Therapeutic Exercise: Pulleys: flexion x 10 sec x 10  Pulley scaption x 10 sec x 10  Pulley horizontal ab/add 90/90 x 10  Pendulum 30 CW; 30 CCW Isometric row step back red TB 5 sec x 10  Isometric ER step out red TB 5 sec x 10  Isometric IR step out red TB 5 sec x 10  Sitting w/coregeous ball mid scapula 10 sec scap squeeze x 10  Manual Therapy: Soft tissue mobilization Rt upper quarter PROM Rt shoulder flexion, scaption within tissue tolerance - pt supine  Joint mobs Rt GH joint manually and using pillow case for mobs  Modalities:  Vaso Rt shoulder 34 deg low pressure x10 min  DATE: 10/03/22 Trigger Point Dry-Needling  Treatment instructions: Expect mild to moderate muscle soreness. S/S of pneumothorax if dry needled over a lung field, and to seek immediate medical attention should they occur. Patient verbalized understanding of these instructions and education.  Patient Consent Given: Yes Education handout provided: Yes Muscles treated: Pecs Electrical stimulation performed:  No Parameters: N/A Treatment response/outcome: twitch response, increased muscle length  PATIENT EDUCATION: Education details: POC; HEP  Person educated: Patient Education method: Programmer, multimedia, Facilities manager, Actor cues, Verbal cues, and Handouts Education comprehension: verbalized understanding, returned demonstration, verbal cues required, tactile cues required, and needs further education  HOME EXERCISE PROGRAM: Access Code: [email or web address] URL: [email or web address] Date: 10/10/2022 Prepared by: Vernon Prey April Kirstie Peri  Exercises - Circular Shoulder Pendulum with Table Support  - 3-4 x daily - 7 x weekly - 1 sets - 20-30 reps - Seated Cervical Retraction  - 3 x daily - 7 x weekly - 1 sets - 10 reps - Standing Scapular Retraction  - 3 x daily - 7 x weekly - 1 sets - 10 reps - 10 hold - Standing Infraspinatus/Teres Minor Release with Ball at Wall  - 2 x daily - 7 x weekly - Standing Pectoral Release with Shoulder Abduction with Ball at Wall  - 2-3 x daily - 7 x weekly - Seated Cervical Rotation AROM  - 2 x daily - 7 x weekly - 1 sets - 5 reps - 2-3 sec  hold - Seated Cervical Sidebending AROM  - 2 x daily - 7 x weekly - 1 sets - 5 reps - 5-10 sec  hold - Isometric Shoulder Abduction at Wall  - 2 x daily - 7 x weekly - 1 sets - 5-10 reps - 5 sec  hold - Isometric Shoulder External Rotation at Wall  - 2 x daily - 7 x weekly - 1 sets - 5 reps - 5 sec  hold - Isometric Shoulder Extension at Wall  - 2 x daily - 7 x weekly - 1 sets - 5-10 reps - 5 sec  hold - Standing Isometric Shoulder Internal Rotation with Towel Roll at Doorway  - 2 x daily - 7 x weekly - 1 sets - 5 reps - 5 sec  hold - Shoulder Scar Massage  - 2 x daily - 7 x weekly - 1 sets - 1 reps - 5 min  hold - Standing Shoulder and Trunk Flexion at Table  - 2 x daily - 7 x weekly - 1 sets - 5 reps - 5-10 sec  hold - Supine Chest Stretch on Foam Roll  - 2 x daily - 7 x weekly - 1 sets - 1 reps - 2-5 min  sec  hold -  Seated Shoulder Flexion AAROM with Pulley Behind  - 2 x daily - 7 x weekly - 1 sets - 10 reps - 10 sec  hold - Seated Shoulder Scaption AAROM with Pulley at Side  - 2 x daily - 7 x weekly - 1 sets - 10 reps - 10sec  hold - Standing Shoulder Row Reactive Isometric  - 2 x daily - 7 x weekly - 1 sets - 10 reps - 30-45 sec  hold - Shoulder External Rotation Reactive Isometrics  -  1 x daily - 7 x weekly - 1 sets - 10 reps - 3-5 sec  hold - Shoulder Internal Rotation Reactive Isometrics  - 2 x daily - 7 x weekly - 1 sets - 10 reps - 3-5 sec  hold - Seated Shoulder Flexion Slide at Table Top with Forearm in Neutral (Mirrored)  - 1 x daily - 7 x weekly - 1 sets - 10 reps - Seated Shoulder Scaption Slide at Table Top with Forearm in Neutral (Mirrored)  - 1 x daily - 7 x weekly - 1 sets - 10 reps  ASSESSMENT:  CLINICAL IMPRESSION: Continued to work on improving shoulder ROM. Focused primarily on shoulder AAROM. Rechecked pt's STGs -- he has met 2/3 goals. STG 1 still limited due where we are in his shoulder protocol. Remains very tight with shoulder elevation.    OBJECTIVE IMPAIRMENTS: s/p Rt RCR 08/22/22 following a fall ~ 6 weeks before with injury to Rt shoulder. He had a previous Rt RCR ~ 8 years ago. Patient presents with Rt UE in abduction sling. He has limited functional ability with Rt UE; decreased ROM, strength, function Rt UE; continued edema Rt shoulder; poor posture and alignment; decreased scapular stability/strength.decreased activity tolerance, decreased mobility, decreased ROM, decreased strength, hypomobility, increased edema, increased fascial restrictions, impaired flexibility, impaired UE functional use, improper body mechanics, postural dysfunction, and pain.    GOALS: Goals reviewed with patient? Yes  SHORT TERM GOALS: Target date: 10/10/2022  Increase AROM Rt shoulder to 90 deg elevation Baseline: Goal status: IN PROGRESS  2.  Independent in initial HEP  Baseline:  Goal  status: MET  3.  Review and progress with post op precautions, progressing as protocol dictates  Baseline:  Goal status: MET   LONG TERM GOALS: Target date: 11/21/2022  Improve posture and alignment with patient to demonstrate improved upright posture with posterior shoulder girdle engaged  Baseline:  Goal status: INITIAL  2.  Increase AROM Rt shoulder to WFL's in all planes  Baseline:  Goal status: INITIAL  3.  4/5 to 5/5 strength Rt shoulder  Baseline:  Goal status: INITIAL  4.  Patient reports ability to return to functional activities using Rt UE including dressing, grooming, yard work, ADL's  Baseline:  Goal status: INITIAL  5.  Independent HEP (including aquatic program as indicated) Baseline:  Goal status: INITIAL  6.  Improve functional limitation score to 54 Baseline: 4 Goal status: INITIAL  PLAN:  PT FREQUENCY: 2x/week  PT DURATION: 12 weeks  PLANNED INTERVENTIONS: Therapeutic exercises, Therapeutic activity, Neuromuscular re-education, Patient/Family education, Self Care, Joint mobilization, Aquatic Therapy, Dry Needling, Electrical stimulation, Cryotherapy, Moist heat, Taping, Vasopneumatic device, Ultrasound, Ionotophoresis 4mg /ml Dexamethasone, Manual therapy, and Re-evaluation  PLAN FOR NEXT SESSION: review and progress exercises per protocol; manual work, DN, modalities as indicated; progressing exercises as indicated per protocol    Cerina Leary April Ma L Holland Kotter, PT 10/10/2022, 10:08 AM

## 2022-10-12 ENCOUNTER — Encounter: Payer: Self-pay | Admitting: Physical Therapy

## 2022-10-12 ENCOUNTER — Ambulatory Visit: Payer: Medicare Other | Admitting: Physical Therapy

## 2022-10-12 DIAGNOSIS — M25511 Pain in right shoulder: Secondary | ICD-10-CM | POA: Diagnosis not present

## 2022-10-12 DIAGNOSIS — M25611 Stiffness of right shoulder, not elsewhere classified: Secondary | ICD-10-CM

## 2022-10-12 DIAGNOSIS — R293 Abnormal posture: Secondary | ICD-10-CM

## 2022-10-12 DIAGNOSIS — M6281 Muscle weakness (generalized): Secondary | ICD-10-CM

## 2022-10-12 DIAGNOSIS — R29898 Other symptoms and signs involving the musculoskeletal system: Secondary | ICD-10-CM

## 2022-10-12 NOTE — Therapy (Signed)
OUTPATIENT PHYSICAL THERAPY SHOULDER TREATMENT   Patient Name: Johnny Arnold MRN: [id number] DOB:06/16/1955, 67 y.o., male Today's Date: 10/12/2022  END OF SESSION:  PT End of Session - 10/12/22 1011     Visit Number 14    Number of Visits 24    Date for PT Re-Evaluation 11/21/22    Authorization Type blue medicare $10 copay    Progress Note Due on Visit 20    PT Start Time 1015    PT Stop Time 1100    PT Time Calculation (min) 45 min    Activity Tolerance Patient tolerated treatment well             History reviewed. No pertinent past medical history. Past Surgical History:  Procedure Laterality Date   left total hip arthroplasty Left 06/03/2019   There are no problems to display for this patient.   PCP: Dr Lasandra Beech  REFERRING PROVIDER: Shon Baton, PA-C; Dr Theophilus Bones  REFERRING DIAG: Rt RCR   THERAPY DIAG:  Acute pain of right shoulder  Stiffness of right shoulder, not elsewhere classified  Muscle weakness (generalized)  Other symptoms and signs involving the musculoskeletal system  Abnormal posture  Rationale for Evaluation and Treatment: Rehabilitation  ONSET DATE: 08/22/22  SUBJECTIVE:                                                                                                                                                                                      SUBJECTIVE STATEMENT: Pt reports is not bad right now. Was sore after last session.   EVAL: Patient reports that he fell off a ladder ~ 7 weeks ago with injury to Rt shoulder. Underwent Rt RCR arthroscopically 08/22/22 with some continued bleeding following surgery. No longer bleeding (stopped Saturday 08/26/22). In abduction sling except for dressing.  Hand dominance: Right  PERTINENT HISTORY: Rt RCR ~ 8 years ago; Lt THA 5 yrs ago; lumbar surgery 15 yrs ago; history of chronic LBP; asthma; arthritis; sleep apnea  PAIN:  Are you having pain? Yes: NPRS scale: 2-3/10 Pain  location: shoulder and biceps Pain description: dull aching  Aggravating factors: moving and getting in and out of the sling  Relieving factors: meds; ice  PRECAUTIONS: Shoulder- post op protocol  WEIGHT BEARING RESTRICTIONS: Yes no wt bearing Rt UE   FALLS:  Has patient fallen in last 6 months? Yes. 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   UPPER EXTREMITY ROM:   Active/Passive ROM Right Eval Passive  Sitting  Rtight  09/21/22 Passive  Supine  Left Eval AROM Standing   Shoulder flexion 75 deg 112 136  Shoulder extension   45  Shoulder abduction   140  Shoulder adduction     Shoulder internal rotation   Thumb &9 pain  Shoulder external rotation   90 shoulder at 90 deg, elbow 90 deg   Elbow flexion     Elbow extension     Wrist flexion     Wrist extension     Wrist ulnar deviation     Wrist radial deviation     Wrist pronation     Wrist supination     (Blank rows = not tested) Cervical ROM - tight end ranges greatest with Lt lateral flexion and Lt rotation   UPPER EXTREMITY MMT:  MMT Right eval Left eval  Shoulder flexion    Shoulder extension    Shoulder abduction    Shoulder adduction    Shoulder internal rotation    Shoulder external rotation    Middle trapezius    Lower trapezius    Elbow flexion    Elbow extension    Wrist flexion    Wrist extension    Wrist ulnar deviation    Wrist radial deviation    Wrist pronation    Wrist supination     Grip strength (lbs)    (Blank rows = not tested)  PALPATION: muscular tightness Rt shoulder girdle    [facility] Adult PT Treatment:                                                DATE: 10/12/22 Therapeutic Exercise: Seated Pulleys: flexion x 1 min, scaption x 1 min, shoulder horizontal abd x1 min Scap squeeze 2x10 with coregeous ball Standing Doorway stretch low x30 sec Reactive iso shoulder ER/IR/flex/row green TB x10 each Supine Shoulder flexion AAROM with dowel x10 Shoulder ER AAROM with dowel x10 by side, at 60 deg x10 Shoulder abd AAROM with dowel x10 "W" 2x10 Serratus punch 2x10 Sidelying Scapular clock each direction Manual Therapy: Soft tissue mobilization Rt upper quarter PROM Rt shoulder flexion, scaption within tissue tolerance - pt supine  Joint mobs Rt GH joint manually and using pillow case for mobs  Modalities: Vaso R shoulder 34 deg, low pressure, 10 min   [facility] Adult PT Treatment:                                                DATE: 10/10/22 Therapeutic Exercise: Pulleys: flexion x 1 min, scaption x 1 min Supine Shoulder flexion AAROM with dowel x15 Shoulder ER AAROM with dowel 2x10 Shoulder ER AAROM at 60 deg abd with dowel 2x10 Shoulder scaption AAROM with dowel 2x10 Seated Shoulder flexion AAROM on table x10 Shoulder scaption AAROM on table x10 Shoulder horizontal abd AAROM on table x10 Scap squeeze 2x10 Scapular clock x10 Manual Therapy:  PROM Rt shoulder flexion, scaption within tissue tolerance - pt supine  Joint mobs Rt GH joint manually  Modalities: Vaso R shoulder 34 deg, low pressure, 10 min     [facility] Adult PT Treatment:                                                DATE: 10/05/22 Therapeutic Exercise: Pulleys: flexion x 10 sec x 10  Pulley scaption x 10 sec x 10  Pulley horizontal ab/add 90/90 x 10  Pendulum 30 CW; 30 CCW Isometric row step back red TB 5 sec x 10  Isometric ER step out red TB 5 sec x 10  Isometric IR step out red TB 5 sec  x 10  Sitting w/coregeous ball mid scapula 10 sec scap squeeze x 10  Manual Therapy: Soft tissue mobilization Rt upper quarter PROM Rt shoulder flexion, scaption within tissue tolerance - pt supine  Joint mobs Rt GH joint manually and using pillow case for mobs  Modalities: Vaso Rt shoulder 34 deg low pressure x10 min  DATE: 10/03/22 Trigger Point Dry-Needling  Treatment instructions: Expect mild to moderate muscle soreness. S/S of pneumothorax if dry needled over a lung field, and to seek immediate medical attention should they occur. Patient verbalized understanding of these instructions and education.  Patient Consent Given: Yes Education handout provided: Yes Muscles treated: Pecs Electrical stimulation performed: No Parameters: N/A Treatment response/outcome: twitch response, increased muscle length  PATIENT EDUCATION: Education details: POC; HEP  Person educated: Patient Education method: Programmer, multimedia, Facilities manager, Actor cues, Verbal cues, and Handouts Education comprehension: verbalized understanding, returned demonstration, verbal cues required, tactile cues required, and needs further education  HOME EXERCISE PROGRAM: Access Code: [email or web address] URL: [email or web address] Date: 10/10/2022 Prepared by: Vernon Prey April Kirstie Peri  Exercises - Circular Shoulder Pendulum with Table Support  - 3-4 x daily - 7 x weekly - 1 sets - 20-30 reps - Seated Cervical Retraction  - 3 x daily - 7 x weekly - 1 sets - 10 reps - Standing Scapular Retraction  - 3 x daily - 7 x weekly - 1 sets - 10 reps - 10 hold - Standing Infraspinatus/Teres Minor Release with Ball at Wall  - 2 x daily - 7 x weekly - Standing Pectoral Release with Shoulder Abduction with Ball at Wall  - 2-3 x daily - 7 x weekly - Seated Cervical Rotation AROM  - 2 x daily - 7 x weekly - 1 sets - 5 reps - 2-3 sec  hold - Seated Cervical Sidebending AROM  - 2 x daily - 7 x weekly - 1 sets - 5 reps - 5-10 sec   hold - Isometric Shoulder Abduction at Wall  - 2 x daily - 7 x weekly - 1 sets - 5-10 reps - 5 sec  hold - Isometric Shoulder External Rotation at Wall  - 2 x daily - 7 x weekly - 1 sets - 5 reps - 5 sec  hold - Isometric Shoulder Extension at Wall  - 2 x daily - 7 x weekly - 1 sets - 5-10 reps - 5 sec  hold - Standing Isometric Shoulder Internal Rotation with Towel Roll at Doorway  - 2 x daily - 7 x weekly - 1 sets - 5 reps - 5 sec  hold - Shoulder Scar Massage  - 2 x daily -  7 x weekly - 1 sets - 1 reps - 5 min  hold - Standing Shoulder and Trunk Flexion at Table  - 2 x daily - 7 x weekly - 1 sets - 5 reps - 5-10 sec  hold - Supine Chest Stretch on Foam Roll  - 2 x daily - 7 x weekly - 1 sets - 1 reps - 2-5 min  sec  hold - Seated Shoulder Flexion AAROM with Pulley Behind  - 2 x daily - 7 x weekly - 1 sets - 10 reps - 10 sec  hold - Seated Shoulder Scaption AAROM with Pulley at Side  - 2 x daily - 7 x weekly - 1 sets - 10 reps - 10sec  hold - Standing Shoulder Row Reactive Isometric  - 2 x daily - 7 x weekly - 1 sets - 10 reps - 30-45 sec  hold - Shoulder External Rotation Reactive Isometrics  - 1 x daily - 7 x weekly - 1 sets - 10 reps - 3-5 sec  hold - Shoulder Internal Rotation Reactive Isometrics  - 2 x daily - 7 x weekly - 1 sets - 10 reps - 3-5 sec  hold - Seated Shoulder Flexion Slide at Table Top with Forearm in Neutral (Mirrored)  - 1 x daily - 7 x weekly - 1 sets - 10 reps - Seated Shoulder Scaption Slide at Table Top with Forearm in Neutral (Mirrored)  - 1 x daily - 7 x weekly - 1 sets - 10 reps  ASSESSMENT:  CLINICAL IMPRESSION: Continued to work on improving shoulder ROM with PROM and AAROM. Joint mobility continues to improve slowly. Continued isometric strengthening   OBJECTIVE IMPAIRMENTS: s/p Rt RCR 08/22/22 following a fall ~ 6 weeks before with injury to Rt shoulder. He had a previous Rt RCR ~ 8 years ago. Patient presents with Rt UE in abduction sling. He has limited  functional ability with Rt UE; decreased ROM, strength, function Rt UE; continued edema Rt shoulder; poor posture and alignment; decreased scapular stability/strength.decreased activity tolerance, decreased mobility, decreased ROM, decreased strength, hypomobility, increased edema, increased fascial restrictions, impaired flexibility, impaired UE functional use, improper body mechanics, postural dysfunction, and pain.    GOALS: Goals reviewed with patient? Yes  SHORT TERM GOALS: Target date: 10/10/2022  Increase AROM Rt shoulder to 90 deg elevation Baseline: Goal status: IN PROGRESS  2.  Independent in initial HEP  Baseline:  Goal status: MET  3.  Review and progress with post op precautions, progressing as protocol dictates  Baseline:  Goal status: MET   LONG TERM GOALS: Target date: 11/21/2022  Improve posture and alignment with patient to demonstrate improved upright posture with posterior shoulder girdle engaged  Baseline:  Goal status: INITIAL  2.  Increase AROM Rt shoulder to WFL's in all planes  Baseline:  Goal status: INITIAL  3.  4/5 to 5/5 strength Rt shoulder  Baseline:  Goal status: INITIAL  4.  Patient reports ability to return to functional activities using Rt UE including dressing, grooming, yard work, ADL's  Baseline:  Goal status: INITIAL  5.  Independent HEP (including aquatic program as indicated) Baseline:  Goal status: INITIAL  6.  Improve functional limitation score to 54 Baseline: 4 Goal status: INITIAL  PLAN:  PT FREQUENCY: 2x/week  PT DURATION: 12 weeks  PLANNED INTERVENTIONS: Therapeutic exercises, Therapeutic activity, Neuromuscular re-education, Patient/Family education, Self Care, Joint mobilization, Aquatic Therapy, Dry Needling, Electrical stimulation, Cryotherapy, Moist heat, Taping, Vasopneumatic device, Ultrasound, Ionotophoresis 4mg /ml  Dexamethasone, Manual therapy, and Re-evaluation  PLAN FOR NEXT SESSION: review and progress  exercises per protocol; manual work, DN, modalities as indicated; progressing exercises as indicated per protocol    Roni Friberg April Ma L Paden Kuras, PT 10/12/2022, 10:13 AM

## 2022-10-17 ENCOUNTER — Ambulatory Visit: Payer: Medicare Other | Admitting: Rehabilitative and Restorative Service Providers"

## 2022-10-17 ENCOUNTER — Encounter: Payer: Self-pay | Admitting: Rehabilitative and Restorative Service Providers"

## 2022-10-17 DIAGNOSIS — R293 Abnormal posture: Secondary | ICD-10-CM

## 2022-10-17 DIAGNOSIS — M25511 Pain in right shoulder: Secondary | ICD-10-CM | POA: Diagnosis not present

## 2022-10-17 DIAGNOSIS — M6281 Muscle weakness (generalized): Secondary | ICD-10-CM

## 2022-10-17 DIAGNOSIS — M25611 Stiffness of right shoulder, not elsewhere classified: Secondary | ICD-10-CM

## 2022-10-17 DIAGNOSIS — R29898 Other symptoms and signs involving the musculoskeletal system: Secondary | ICD-10-CM

## 2022-10-17 NOTE — Therapy (Signed)
OUTPATIENT PHYSICAL THERAPY SHOULDER TREATMENT   Patient Name: Johnny Arnold MRN: [id number] DOB:[birth year], 67 y.o., male Today's Date: 10/17/2022  END OF SESSION:  PT End of Session - 10/17/22 1015     Visit Number 15    Number of Visits 24    Date for PT Re-Evaluation 11/21/22    Authorization Type blue medicare $10 copay    Progress Note Due on Visit 20    PT Start Time 1015    PT Stop Time 1104    PT Time Calculation (min) 49 min    Activity Tolerance Patient tolerated treatment well             History reviewed. No pertinent past medical history. Past Surgical History:  Procedure Laterality Date   left total hip arthroplasty Left 06/03/2019   There are no problems to display for this patient.   PCP: Dr Lasandra Beech  REFERRING PROVIDER: Shon Baton, PA-C; Dr Theophilus Bones  REFERRING DIAG: Rt RCR   THERAPY DIAG:  Acute pain of right shoulder  Stiffness of right shoulder, not elsewhere classified  Muscle weakness (generalized)  Other symptoms and signs involving the musculoskeletal system  Abnormal posture  Rationale for Evaluation and Treatment: Rehabilitation  ONSET DATE: 08/22/22  SUBJECTIVE:                                                                                                                                                                                      SUBJECTIVE STATEMENT: Pt reports shoulder is sore from the exercise lying on back with arms out to side. Wants to play golf. Has done some chipping but not putting.    EVAL: Patient reports that he fell off a ladder ~ 7 weeks ago with injury to Rt shoulder. Underwent Rt RCR arthroscopically 08/22/22 with some continued bleeding following surgery. No longer bleeding (stopped Saturday 08/26/22). In abduction sling except for dressing.  Hand dominance: Right  PERTINENT HISTORY: Rt RCR ~ 8 years ago; Lt THA 5 yrs ago; lumbar surgery 15 yrs ago; history of chronic LBP; asthma;  arthritis; sleep apnea  PAIN:  Are you having pain? Yes: NPRS scale: 3-4/10 Pain location: shoulder and biceps Pain description: dull aching  Aggravating factors: moving and getting in and out of the sling  Relieving factors: meds; ice  PRECAUTIONS: Shoulder- post op protocol  WEIGHT BEARING RESTRICTIONS: Yes no wt bearing Rt UE   FALLS:  Has patient fallen in last 6 months? Yes. 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   UPPER EXTREMITY ROM:   Active/Passive ROM Right Eval Passive  Sitting  Right  09/21/22 Passive  Supine  Right  10/17/22 Active  Standing  Left Eval AROM Standing   Shoulder flexion 75 deg 112 115 136  Shoulder extension   45 45  Shoulder abduction   63 140  Shoulder adduction      Shoulder internal rotation   Hand to lower sacrum  Thumb &9 pain  Shoulder external rotation   50 Shoulder in neutral at side  90 shoulder at 90 deg, elbow 90 deg   Elbow flexion      Elbow extension      Wrist flexion      Wrist extension      Wrist ulnar deviation      Wrist radial deviation      Wrist pronation      Wrist supination      (Blank rows = not tested) Cervical ROM - tight end ranges greatest with Lt lateral flexion and Lt rotation   UPPER EXTREMITY MMT:  MMT Right eval Left eval  Shoulder flexion    Shoulder extension    Shoulder abduction    Shoulder adduction    Shoulder internal rotation    Shoulder external rotation    Middle trapezius     Lower trapezius    Elbow flexion    Elbow extension    Wrist flexion    Wrist extension    Wrist ulnar deviation    Wrist radial deviation    Wrist pronation    Wrist supination    Grip strength (lbs)    (Blank rows = not tested)  PALPATION: muscular tightness Rt shoulder girdle    [facility] Adult PT Treatment:                                                DATE: 10/17/22 Therapeutic Exercise: Seated Pulleys: flexion x 10 sec x 10 reps; scaption x 10 sec x 10 reps; shoulder horizontal abd x 1 min Scap squeeze 2x10 with coregeous ball Standing UBE L4 x 3 min alt fwd/back Doorway stretch low x30 sec Row green TB 3 sec x 10 x 2  ER green TB 3 sec x 10 x 2  IR green TB 3 sec x 10 x 2  Ball on wall 2 min fwd flexion moving ball up down, side to side, circles CW/CCW  Shoulder flexion stretch hands on counter stepping back 30 sec x 3 Supine  Sidelying  Manual Therapy: Soft tissue mobilization Rt upper quarter PROM Rt shoulder flexion, scaption within tissue tolerance - pt supine  Joint mobs Rt GH joint manually and using pillow case for mobs sitting and supine Modalities: Vaso R shoulder 34 deg, low pressure, 10 min   [facility] Adult PT Treatment:  DATE: 10/12/22 Therapeutic Exercise: Seated Pulleys: flexion x 1 min, scaption x 1 min, shoulder horizontal abd x1 min Scap squeeze 2x10 with coregeous ball Standing Doorway stretch low x30 sec Reactive iso shoulder ER/IR/flex/row green TB x10 each Supine Shoulder flexion AAROM with dowel x10 Shoulder ER AAROM with dowel x10 by side, at 60 deg x10 Shoulder abd AAROM with dowel x10 "W" 2x10 Serratus punch 2x10 Sidelying Scapular clock each direction Manual Therapy: Soft tissue mobilization Rt upper quarter PROM Rt shoulder flexion, scaption within tissue tolerance - pt supine  Joint mobs Rt GH joint manually and using pillow case for mobs   Modalities: Vaso R shoulder 34 deg, low  pressure, 10 min    DATE: 10/03/22 Trigger Point Dry-Needling  Treatment instructions: Expect mild to moderate muscle soreness. S/S of pneumothorax if dry needled over a lung field, and to seek immediate medical attention should they occur. Patient verbalized understanding of these instructions and education.  Patient Consent Given: Yes Education handout provided: Yes Muscles treated: Pecs Electrical stimulation performed: No Parameters: N/A Treatment response/outcome: twitch response, increased muscle length  PATIENT EDUCATION: Education details: POC; HEP  Person educated: Patient Education method: Programmer, multimedia, Facilities manager, Actor cues, Verbal cues, and Handouts Education comprehension: verbalized understanding, returned demonstration, verbal cues required, tactile cues required, and needs further education  HOME EXERCISE PROGRAM:  Access Code: [email or web address] URL: [email or web address] Date: 10/17/2022 Prepared by: Corlis Leak  Exercises - Circular Shoulder Pendulum with Table Support  - 3-4 x daily - 7 x weekly - 1 sets - 20-30 reps - Seated Cervical Retraction  - 3 x daily - 7 x weekly - 1 sets - 10 reps - Standing Scapular Retraction  - 3 x daily - 7 x weekly - 1 sets - 10 reps - 10 hold - Standing Infraspinatus/Teres Minor Release with Ball at Wall  - 2 x daily - 7 x weekly - Standing Pectoral Release with Shoulder Abduction with Ball at Wall  - 2-3 x daily - 7 x weekly - Seated Cervical Rotation AROM  - 2 x daily - 7 x weekly - 1 sets - 5 reps - 2-3 sec  hold - Seated Cervical Sidebending AROM  - 2 x daily - 7 x weekly - 1 sets - 5 reps - 5-10 sec  hold - Isometric Shoulder Abduction at Wall  - 2 x daily - 7 x weekly - 1 sets - 5-10 reps - 5 sec  hold - Isometric Shoulder External Rotation at Wall  - 2 x daily - 7 x weekly - 1 sets - 5 reps - 5 sec  hold - Isometric Shoulder Extension at Wall  - 2 x daily - 7 x weekly - 1 sets - 5-10 reps - 5 sec  hold - Standing  Isometric Shoulder Internal Rotation with Towel Roll at Doorway  - 2 x daily - 7 x weekly - 1 sets - 5 reps - 5 sec  hold - Shoulder Scar Massage  - 2 x daily - 7 x weekly - 1 sets - 1 reps - 5 min  hold - Standing Shoulder and Trunk Flexion at Table  - 2 x daily - 7 x weekly - 1 sets - 5 reps - 5-10 sec  hold - Supine Chest Stretch on Foam Roll  - 2 x daily - 7 x weekly - 1 sets - 1 reps - 2-5 min  sec  hold - Seated Shoulder Flexion AAROM with Pulley Behind  - 2 x daily -  7 x weekly - 1 sets - 10 reps - 10 sec  hold - Seated Shoulder Scaption AAROM with Pulley at Side  - 2 x daily - 7 x weekly - 1 sets - 10 reps - 10sec  hold - Standing Shoulder Row Reactive Isometric  - 2 x daily - 7 x weekly - 1 sets - 10 reps - 30-45 sec  hold - Shoulder External Rotation Reactive Isometrics  - 1 x daily - 7 x weekly - 1 sets - 10 reps - 3-5 sec  hold - Shoulder Internal Rotation Reactive Isometrics  - 2 x daily - 7 x weekly - 1 sets - 10 reps - 3-5 sec  hold - Seated Shoulder Flexion Slide at Table Top with Forearm in Neutral (Mirrored)  - 1 x daily - 7 x weekly - 1 sets - 10 reps - Seated Shoulder Scaption Slide at Table Top with Forearm in Neutral (Mirrored)  - 1 x daily - 7 x weekly - 1 sets - 10 reps - Standing Bilateral Low Shoulder Row with Anchored Resistance  - 1 x daily - 7 x weekly - 1-3 sets - 10 reps - 2-3 sec  hold - Shoulder External Rotation with Anchored Resistance  - 1 x daily - 7 x weekly - 1-2 sets - 10 reps - 3 sec  hold - Shoulder Internal Rotation with Resistance  - 1 x daily - 7 x weekly - 2 sets - 10 reps - 3 sec  hold - Shoulder Extension with Resistance  - 1 x daily - 7 x weekly - 2 sets - 10 reps - 3 sec  hold - Standing Wall [employer] with Mini Swiss Ball  - 1 x daily - 7 x weekly - 1 sets - Standing Wall [employer] in Scaption with Mini Swiss Ball  - 1 x daily - 7 x weekly - 1 sets ASSESSMENT:  CLINICAL IMPRESSION: Progressing well with increasing active and passive ROM Rt  shoulder. Added resistive exercises and work on [employer]. Continued to work on improving shoulder ROM with PROM and AAROM. Joint mobility continues to improve slowly. Progressing well toward stated goals of therapy   OBJECTIVE IMPAIRMENTS: s/p Rt RCR 08/22/22 following a fall ~ 6 weeks before with injury to Rt shoulder. He had a previous Rt RCR ~ 8 years ago. Patient presents with Rt UE in abduction sling. He has limited functional ability with Rt UE; decreased ROM, strength, function Rt UE; continued edema Rt shoulder; poor posture and alignment; decreased scapular stability/strength.decreased activity tolerance, decreased mobility, decreased ROM, decreased strength, hypomobility, increased edema, increased fascial restrictions, impaired flexibility, impaired UE functional use, improper body mechanics, postural dysfunction, and pain.    GOALS: Goals reviewed with patient? Yes  SHORT TERM GOALS: Target date: 10/10/2022  Increase AROM Rt shoulder to 90 deg elevation Baseline: Goal status: IN PROGRESS  2.  Independent in initial HEP  Baseline:  Goal status: MET  3.  Review and progress with post op precautions, progressing as protocol dictates  Baseline:  Goal status: MET   LONG TERM GOALS: Target date: 11/21/2022  Improve posture and alignment with patient to demonstrate improved upright posture with posterior shoulder girdle engaged  Baseline:  Goal status: INITIAL  2.  Increase AROM Rt shoulder to WFL's in all planes  Baseline:  Goal status: INITIAL  3.  4/5 to 5/5 strength Rt shoulder  Baseline:  Goal status: INITIAL  4.  Patient reports ability to return to functional  activities using Rt UE including dressing, grooming, yard work, ADL's  Baseline:  Goal status: INITIAL  5.  Independent HEP (including aquatic program as indicated) Baseline:  Goal status: INITIAL  6.  Improve functional limitation score to 54 Baseline: 4 Goal status: INITIAL  PLAN:  PT FREQUENCY:  2x/week  PT DURATION: 12 weeks  PLANNED INTERVENTIONS: Therapeutic exercises, Therapeutic activity, Neuromuscular re-education, Patient/Family education, Self Care, Joint mobilization, Aquatic Therapy, Dry Needling, Electrical stimulation, Cryotherapy, Moist heat, Taping, Vasopneumatic device, Ultrasound, Ionotophoresis 4mg /ml Dexamethasone, Manual therapy, and Re-evaluation  PLAN FOR NEXT SESSION: review and progress exercises per protocol; manual work, DN, modalities as indicated; progressing exercises as indicated per protocol    Val Riles, PT 10/17/2022, 10:16 AM

## 2022-10-19 ENCOUNTER — Ambulatory Visit: Payer: Medicare Other | Admitting: Rehabilitative and Restorative Service Providers"

## 2022-10-19 ENCOUNTER — Encounter: Payer: Self-pay | Admitting: Rehabilitative and Restorative Service Providers"

## 2022-10-19 DIAGNOSIS — M25511 Pain in right shoulder: Secondary | ICD-10-CM | POA: Diagnosis not present

## 2022-10-19 DIAGNOSIS — M25611 Stiffness of right shoulder, not elsewhere classified: Secondary | ICD-10-CM

## 2022-10-19 DIAGNOSIS — R29898 Other symptoms and signs involving the musculoskeletal system: Secondary | ICD-10-CM

## 2022-10-19 DIAGNOSIS — R293 Abnormal posture: Secondary | ICD-10-CM

## 2022-10-19 DIAGNOSIS — M6281 Muscle weakness (generalized): Secondary | ICD-10-CM

## 2022-10-19 NOTE — Therapy (Signed)
OUTPATIENT PHYSICAL THERAPY SHOULDER TREATMENT   Patient Name: Johnny Arnold MRN: [id number] DOB:1955-10-04, 67 y.o., male Today's Date: 10/19/2022  END OF SESSION:  PT End of Session - 10/19/22 1024     Visit Number 16    Number of Visits 24    Date for PT Re-Evaluation 11/21/22    Authorization Type 20    PT Start Time 1015    PT Stop Time 1105    PT Time Calculation (min) 50 min    Activity Tolerance Patient tolerated treatment well             History reviewed. No pertinent past medical history. Past Surgical History:  Procedure Laterality Date   left total hip arthroplasty Left 06/03/2019   There are no problems to display for this patient.   PCP: Dr Lasandra Beech  REFERRING PROVIDER: Shon Baton, PA-C; Dr Theophilus Bones  REFERRING DIAG: Rt RCR   THERAPY DIAG:  Acute pain of right shoulder  Stiffness of right shoulder, not elsewhere classified  Muscle weakness (generalized)  Other symptoms and signs involving the musculoskeletal system  Abnormal posture  Rationale for Evaluation and Treatment: Rehabilitation  ONSET DATE: 08/22/22  SUBJECTIVE:                                                                                                                                                                                      SUBJECTIVE STATEMENT: Pt reports shoulder is sore from swatting at bee and felt some pain which lasted through the day. Some soreness this am but less pain than yesterday. Can tell he is using his Rt UE for more functional activities.  Wants to play golf. Has done some chipping but not putting.    EVAL: Patient reports that he fell off a ladder ~ 7 weeks ago with injury to Rt shoulder. Underwent Rt RCR arthroscopically 08/22/22 with some continued bleeding following surgery. No longer bleeding (stopped Saturday 08/26/22). In abduction sling except for dressing.  Hand dominance: Right  PERTINENT HISTORY: Rt RCR ~ 8 years ago; Lt THA 5  yrs ago; lumbar surgery 15 yrs ago; history of chronic LBP; asthma; arthritis; sleep apnea  PAIN:  Are you having pain? Yes: NPRS scale: 4-5/10 Pain location: shoulder and biceps Pain description: dull aching  Aggravating factors: moving and getting in and out of the sling  Relieving factors: meds; ice  PRECAUTIONS: Shoulder- post op protocol  WEIGHT BEARING RESTRICTIONS: Yes no wt bearing Rt UE   FALLS:  Has patient fallen in last 6 months? Yes. 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   UPPER EXTREMITY ROM:   Active/Passive ROM Right Eval Passive  Sitting  Right  09/21/22 Passive  Supine  Right  10/17/22 Active  Standing  Left Eval AROM Standing   Shoulder flexion 75 deg 112 115 136  Shoulder extension   45 45  Shoulder abduction   63 140  Shoulder adduction      Shoulder internal rotation   Hand to lower sacrum  Thumb &9 pain  Shoulder external rotation   50 Shoulder in neutral at side  90 shoulder at 90 deg, elbow 90 deg   Elbow flexion      Elbow extension      Wrist flexion      Wrist extension      Wrist ulnar deviation      Wrist radial deviation      Wrist pronation      Wrist supination      (Blank rows = not tested) Cervical ROM - tight end ranges greatest with Lt lateral flexion and Lt rotation   UPPER EXTREMITY MMT:  MMT Right eval Left eval  Shoulder flexion    Shoulder extension    Shoulder abduction    Shoulder adduction    Shoulder  internal rotation    Shoulder external rotation    Middle trapezius    Lower trapezius    Elbow flexion    Elbow extension    Wrist flexion    Wrist extension    Wrist ulnar deviation    Wrist radial deviation    Wrist pronation    Wrist supination    Grip strength (lbs)    (Blank rows = not tested)  PALPATION: muscular tightness Rt shoulder girdle    [facility] Adult PT Treatment:                                                DATE: 10/19/22 Therapeutic Exercise: Seated Pulleys: flexion x 10 sec x 10 reps; scaption x 10 sec x 10 reps; shoulder horizontal abd x 1 min Scap squeeze 2x10 with coregeous ball Joint mobs in sitting arm supported on table for inferior glide  Standing UBE L4 x 3 min alt fwd/back Doorway stretch low x30 sec Row green TB 3 sec x 10 x 2  ER green TB 3 sec x 10 x 2  IR green TB 3 sec x 10 x 2  Abduction green TB 3 sec 10 x 2  Ball on wall 2 min fwd flexion moving ball up down, side to side, circles CW/CCW  Shoulder flexion stretch hands on counter stepping back 30 sec x 3 Supine  Sidelying  Manual Therapy: Soft tissue mobilization Rt upper quarter patient Lt sidelying and supine  Joint mobs Rt GH joint manually and using pillow case for mobs sitting and supine PROM Rt shoulder flexion, scaption within tissue tolerance - pt supine Modalities: Vaso R shoulder 34 deg, med pressure, 10 min  [facility]  Adult PT Treatment:                                                DATE: 10/17/22 Therapeutic Exercise: Seated Pulleys: flexion x 10 sec x 10 reps; scaption x 10 sec x 10 reps; shoulder horizontal abd x 1 min Scap squeeze 2x10 with coregeous ball Standing UBE L4 x 3 min alt fwd/back Doorway stretch low x30 sec Row green TB 3 sec x 10 x 2  ER green TB 3 sec x 10 x 2  IR green TB 3 sec x 10 x 2  Ball on wall 2 min fwd flexion moving ball up down, side to side, circles CW/CCW  Shoulder flexion stretch hands on counter stepping back 30 sec x  3 Supine  Sidelying  Manual Therapy: Soft tissue mobilization Rt upper quarter PROM Rt shoulder flexion, scaption within tissue tolerance - pt supine  Joint mobs Rt GH joint manually and using pillow case for mobs sitting and supine Modalities: Vaso R shoulder 34 deg, low pressure, 10 min    DATE: 10/03/22 Trigger Point Dry-Needling  Treatment instructions: Expect mild to moderate muscle soreness. S/S of pneumothorax if dry needled over a lung field, and to seek immediate medical attention should they occur. Patient verbalized understanding of these instructions and education.  Patient Consent Given: Yes Education handout provided: Yes Muscles treated: [place] Electrical stimulation performed: No Parameters: N/A Treatment response/outcome: twitch response, increased muscle length  PATIENT EDUCATION: Education details: POC; HEP  Person educated: Patient Education method: Programmer, multimedia, Facilities manager, Actor cues, Verbal cues, and Handouts Education comprehension: verbalized understanding, returned demonstration, verbal cues required, tactile cues required, and needs further education  HOME EXERCISE PROGRAM:  Access Code: [email or web address] URL: [email or web address] Date: 10/17/2022 Prepared by: Corlis Leak  Exercises - Circular Shoulder Pendulum with Table Support  - 3-4 x daily - 7 x weekly - 1 sets - 20-30 reps - Seated Cervical Retraction  - 3 x daily - 7 x weekly - 1 sets - 10 reps - Standing Scapular Retraction  - 3 x daily - 7 x weekly - 1 sets - 10 reps - 10 hold - Standing Infraspinatus/Teres Minor Release with Ball at Wall  - 2 x daily - 7 x weekly - Standing Pectoral Release with Shoulder Abduction with Ball at Wall  - 2-3 x daily - 7 x weekly - Seated Cervical Rotation AROM  - 2 x daily - 7 x weekly - 1 sets - 5 reps - 2-3 sec  hold - Seated Cervical Sidebending AROM  - 2 x daily - 7 x weekly - 1 sets - 5 reps - 5-10 sec  hold - Isometric Shoulder Abduction at  Wall  - 2 x daily - 7 x weekly - 1 sets - 5-10 reps - 5 sec  hold - Isometric Shoulder External Rotation at Wall  - 2 x daily - 7 x weekly - 1 sets - 5 reps - 5 sec  hold - Isometric Shoulder Extension at Wall  - 2 x daily - 7 x weekly - 1 sets - 5-10 reps - 5 sec  hold - Standing Isometric Shoulder Internal Rotation with Towel Roll at Doorway  - 2 x daily - 7 x weekly - 1 sets - 5 reps - 5 sec  hold - Shoulder Scar Massage  - 2 x daily -  7 x weekly - 1 sets - 1 reps - 5 min  hold - Standing Shoulder and Trunk Flexion at Table  - 2 x daily - 7 x weekly - 1 sets - 5 reps - 5-10 sec  hold - Supine Chest Stretch on Foam Roll  - 2 x daily - 7 x weekly - 1 sets - 1 reps - 2-5 min  sec  hold - Seated Shoulder Flexion AAROM with Pulley Behind  - 2 x daily - 7 x weekly - 1 sets - 10 reps - 10 sec  hold - Seated Shoulder Scaption AAROM with Pulley at Side  - 2 x daily - 7 x weekly - 1 sets - 10 reps - 10sec  hold - Standing Shoulder Row Reactive Isometric  - 2 x daily - 7 x weekly - 1 sets - 10 reps - 30-45 sec  hold - Shoulder External Rotation Reactive Isometrics  - 1 x daily - 7 x weekly - 1 sets - 10 reps - 3-5 sec  hold - Shoulder Internal Rotation Reactive Isometrics  - 2 x daily - 7 x weekly - 1 sets - 10 reps - 3-5 sec  hold - Seated Shoulder Flexion Slide at Table Top with Forearm in Neutral (Mirrored)  - 1 x daily - 7 x weekly - 1 sets - 10 reps - Seated Shoulder Scaption Slide at Table Top with Forearm in Neutral (Mirrored)  - 1 x daily - 7 x weekly - 1 sets - 10 reps - Standing Bilateral Low Shoulder Row with Anchored Resistance  - 1 x daily - 7 x weekly - 1-3 sets - 10 reps - 2-3 sec  hold - Shoulder External Rotation with Anchored Resistance  - 1 x daily - 7 x weekly - 1-2 sets - 10 reps - 3 sec  hold - Shoulder Internal Rotation with Resistance  - 1 x daily - 7 x weekly - 2 sets - 10 reps - 3 sec  hold - Shoulder Extension with Resistance  - 1 x daily - 7 x weekly - 2 sets - 10 reps - 3 sec   hold - Standing Wall [employer] with Mini Swiss Ball  - 1 x daily - 7 x weekly - 1 sets - Standing Wall [employer] in Scaption with Mini Swiss Ball  - 1 x daily - 7 x weekly - 1 sets ASSESSMENT:  CLINICAL IMPRESSION: Some increased pain today from swatting a bee yesterday. Focus more on joint mobs and PROM by PT. Progressing well with increasing active and passive ROM Rt shoulder. Continued to work on improving shoulder ROM with PROM and AAROM. Joint mobility continues to improve with positive response to joint mobs and manual work at last visit. Progressing well toward stated goals of therapy   OBJECTIVE IMPAIRMENTS: s/p Rt RCR 08/22/22 following a fall ~ 6 weeks before with injury to Rt shoulder. He had a previous Rt RCR ~ 8 years ago. Patient presents with Rt UE in abduction sling. He has limited functional ability with Rt UE; decreased ROM, strength, function Rt UE; continued edema Rt shoulder; poor posture and alignment; decreased scapular stability/strength.decreased activity tolerance, decreased mobility, decreased ROM, decreased strength, hypomobility, increased edema, increased fascial restrictions, impaired flexibility, impaired UE functional use, improper body mechanics, postural dysfunction, and pain.    GOALS: Goals reviewed with patient? Yes  SHORT TERM GOALS: Target date: 10/10/2022  Increase AROM Rt shoulder to 90 deg elevation Baseline: Goal status: IN PROGRESS  2.  Independent in initial HEP  Baseline:  Goal status: MET  3.  Review and progress with post op precautions, progressing as protocol dictates  Baseline:  Goal status: MET   LONG TERM GOALS: Target date: 11/21/2022  Improve posture and alignment with patient to demonstrate improved upright posture with posterior shoulder girdle engaged  Baseline:  Goal status: INITIAL  2.  Increase AROM Rt shoulder to WFL's in all planes  Baseline:  Goal status: INITIAL  3.  4/5 to 5/5 strength Rt shoulder   Baseline:  Goal status: INITIAL  4.  Patient reports ability to return to functional activities using Rt UE including dressing, grooming, yard work, ADL's  Baseline:  Goal status: INITIAL  5.  Independent HEP (including aquatic program as indicated) Baseline:  Goal status: INITIAL  6.  Improve functional limitation score to 54 Baseline: 4 Goal status: INITIAL  PLAN:  PT FREQUENCY: 2x/week  PT DURATION: 12 weeks  PLANNED INTERVENTIONS: Therapeutic exercises, Therapeutic activity, Neuromuscular re-education, Patient/Family education, Self Care, Joint mobilization, Aquatic Therapy, Dry Needling, Electrical stimulation, Cryotherapy, Moist heat, Taping, Vasopneumatic device, Ultrasound, Ionotophoresis 4mg /ml Dexamethasone, Manual therapy, and Re-evaluation  PLAN FOR NEXT SESSION: review and progress exercises per protocol; manual work, DN, modalities as indicated; progressing exercises as indicated per protocol    Val Riles, PT 10/19/2022, 10:24 AM

## 2022-10-24 ENCOUNTER — Ambulatory Visit: Payer: Medicare Other | Admitting: Rehabilitative and Restorative Service Providers"

## 2022-10-24 ENCOUNTER — Encounter: Payer: Self-pay | Admitting: Rehabilitative and Restorative Service Providers"

## 2022-10-24 DIAGNOSIS — M6281 Muscle weakness (generalized): Secondary | ICD-10-CM

## 2022-10-24 DIAGNOSIS — M25511 Pain in right shoulder: Secondary | ICD-10-CM

## 2022-10-24 DIAGNOSIS — M25611 Stiffness of right shoulder, not elsewhere classified: Secondary | ICD-10-CM

## 2022-10-24 DIAGNOSIS — R29898 Other symptoms and signs involving the musculoskeletal system: Secondary | ICD-10-CM

## 2022-10-24 DIAGNOSIS — R293 Abnormal posture: Secondary | ICD-10-CM

## 2022-10-24 NOTE — Therapy (Signed)
OUTPATIENT PHYSICAL THERAPY SHOULDER TREATMENT   Patient Name: Johnny Arnold MRN: [id number] DOB:04-22-1956, 67 y.o., male Today's Date: 10/24/2022  END OF SESSION:  PT End of Session - 10/24/22 1015     Visit Number 17    Number of Visits 24    Date for PT Re-Evaluation 11/21/22    Authorization Type 20    Progress Note Due on Visit 20    PT Start Time 1014    PT Stop Time 1104    PT Time Calculation (min) 50 min    Activity Tolerance Patient tolerated treatment well             History reviewed. No pertinent past medical history. Past Surgical History:  Procedure Laterality Date   left total hip arthroplasty Left 06/03/2019   There are no problems to display for this patient.   PCP: Dr Lasandra Beech  REFERRING PROVIDER: Shon Baton, PA-C; Dr Theophilus Bones  REFERRING DIAG: Rt RCR   THERAPY DIAG:  Acute pain of right shoulder  Stiffness of right shoulder, not elsewhere classified  Muscle weakness (generalized)  Other symptoms and signs involving the musculoskeletal system  Abnormal posture  Rationale for Evaluation and Treatment: Rehabilitation  ONSET DATE: 08/22/22  SUBJECTIVE:                                                                                                                                                                                      SUBJECTIVE STATEMENT: Pt reports shoulder is hurting some but "it always does'. May have overdone it in the garden this weekend. Wants to play golf. Has done some chipping but not putting.    EVAL: Patient reports that he fell off a ladder ~ 7 weeks ago with injury to Rt shoulder. Underwent Rt RCR arthroscopically 08/22/22 with some continued bleeding following surgery. No longer bleeding (stopped Saturday 08/26/22). In abduction sling except for dressing.  Hand dominance: Right  PERTINENT HISTORY: Rt RCR ~ 8 years ago; Lt THA 5 yrs ago; lumbar surgery 15 yrs ago; history of chronic LBP; asthma;  arthritis; sleep apnea  PAIN:  Are you having pain? Yes: NPRS scale: 4-5/10 Pain location: shoulder and biceps Pain description: dull aching  Aggravating factors: moving and getting in and out of the sling  Relieving factors: meds; ice  PRECAUTIONS: Shoulder- post op protocol  WEIGHT BEARING RESTRICTIONS: Yes no wt bearing Rt UE   FALLS:  Has patient fallen in last 6 months? Yes. 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   UPPER EXTREMITY ROM:   Active/Passive ROM Right Eval Passive  Sitting  Right  09/21/22 Passive  Supine  Right  10/17/22 Active  Standing  Right  10/24/22 Active  Standing  Left Eval AROM Standing    Shoulder flexion 75 deg 112 115 115 136   Shoulder extension   45 52 45   Shoulder abduction   63 72 140   Shoulder adduction        Shoulder internal rotation   Hand to lower sacrum  Hand to sacrum  Thumb &9 pain   Shoulder external rotation   50 Shoulder in neutral at side  64  Shoulder in neutral at side  90 shoulder at 90 deg, elbow 90 deg    Elbow flexion        Elbow extension        Wrist flexion        Wrist extension        Wrist ulnar deviation        Wrist radial deviation        Wrist pronation        Wrist supination        (Blank rows = not tested) Cervical ROM - tight end ranges greatest with Lt lateral flexion and Lt rotation   UPPER EXTREMITY MMT:  MMT Right eval Left eval  Shoulder flexion    Shoulder extension     Shoulder abduction    Shoulder adduction    Shoulder internal rotation    Shoulder external rotation    Middle trapezius    Lower trapezius    Elbow flexion    Elbow extension    Wrist flexion    Wrist extension    Wrist ulnar deviation    Wrist radial deviation    Wrist pronation    Wrist supination    Grip strength (lbs)    (Blank rows = not tested)  PALPATION: muscular tightness Rt shoulder girdle    [facility] Adult PT Treatment:                                                DATE: 10/24/22 Therapeutic Exercise: Seated Pulleys: flexion x 10 sec x 10 reps; scaption x 10 sec x 10 reps; shoulder horizontal abd x 1 min Scap squeeze 2x10 with coregeous ball Joint mobs in sitting arm supported on table for inferior glide  Standing UBE L4 x 3 min alt fwd/back Doorway stretch low x30 sec Row green TB 3 sec x 10 x 2  ER green TB 3 sec x 10 x 2  IR green TB 3 sec x 10 x 2  Abduction green TB 3 sec 10 x 2  Ball on wall 2 min fwd flexion moving ball up down, side to side, circles CW/CCW  Shoulder flexion ball on wall rolling ball into flexion 5-10 sec hold x 10  Shoulder flexion stretch hands on counter stepping back 30 sec x 3  Supine  Prone Shoulder extension 5  sec hold x 10  Row 5 sec x 10  Sidelying ER 5 sec x 10  Manual Therapy: Soft tissue mobilization Rt upper quarter patient Lt sidelying and supine  Joint mobs Rt GH joint manually and using pillow case for mobs sitting and supine PROM Rt shoulder flexion, scaption within tissue tolerance - pt supine Modalities: Moist heat x 10 min  [facility] Adult PT Treatment:                                                DATE: 10/19/22 Therapeutic Exercise: Seated Pulleys: flexion x 10 sec x 10 reps; scaption x 10 sec x 10 reps; shoulder horizontal abd x 1 min Scap squeeze 2x10 with coregeous ball Joint mobs in sitting arm supported on table for inferior glide  Standing UBE L4 x 3 min alt fwd/back Doorway stretch low x30 sec Row  green TB 3 sec x 10 x 2  ER green TB 3 sec x 10 x 2  IR green TB 3 sec x 10 x 2  Abduction green TB 3 sec 10 x 2  Ball on wall 2 min fwd flexion moving ball up down, side to side, circles CW/CCW  Shoulder flexion stretch hands on counter stepping back 30 sec x 3 Supine  Sidelying  Manual Therapy: Soft tissue mobilization Rt upper quarter patient Lt sidelying and supine  Joint mobs Rt GH joint manually and using pillow case for mobs sitting and supine PROM Rt shoulder flexion, scaption within tissue tolerance - pt supine Modalities: Vaso R shoulder 34 deg, med pressure, 10 min   DATE: 10/03/22 Trigger Point Dry-Needling  Treatment instructions: Expect mild to moderate muscle soreness. S/S of pneumothorax if dry needled over a lung field, and to seek immediate medical attention should they occur. Patient verbalized understanding of these instructions and education.  Patient Consent Given: Yes Education handout provided: Yes Muscles treated: [place] Electrical stimulation performed: No Parameters: N/A Treatment response/outcome: twitch response, increased muscle length  PATIENT EDUCATION: Education details: POC; HEP  Person educated: Patient Education method: Programmer, multimedia, Facilities manager, Actor cues, Verbal cues, and Handouts Education comprehension: verbalized understanding, returned demonstration, verbal cues required, tactile cues required, and needs further education  HOME EXERCISE PROGRAM:  Access Code: [email or web address] URL: [email or web address] Date: 10/17/2022 Prepared by: Corlis Leak  Exercises - Circular Shoulder Pendulum with Table Support  - 3-4 x daily - 7 x weekly - 1 sets - 20-30 reps - Seated Cervical Retraction  - 3 x daily - 7 x weekly - 1 sets - 10 reps - Standing Scapular Retraction  - 3 x daily - 7 x weekly - 1 sets - 10 reps - 10 hold - Standing Infraspinatus/Teres Minor Release with Ball at Wall  - 2 x daily - 7 x weekly - Standing Pectoral Release  with Shoulder Abduction with Ball at Wall  - 2-3 x daily - 7 x weekly - Seated Cervical Rotation AROM  - 2 x daily - 7 x weekly - 1 sets - 5 reps - 2-3 sec  hold - Seated Cervical Sidebending AROM  - 2 x daily - 7 x weekly - 1 sets - 5 reps - 5-10 sec  hold - Isometric Shoulder Abduction at Wall  - 2 x daily - 7 x weekly - 1 sets - 5-10 reps - 5 sec  hold - Isometric Shoulder  External Rotation at Wall  - 2 x daily - 7 x weekly - 1 sets - 5 reps - 5 sec  hold - Isometric Shoulder Extension at Wall  - 2 x daily - 7 x weekly - 1 sets - 5-10 reps - 5 sec  hold - Standing Isometric Shoulder Internal Rotation with Towel Roll at Doorway  - 2 x daily - 7 x weekly - 1 sets - 5 reps - 5 sec  hold - Shoulder Scar Massage  - 2 x daily - 7 x weekly - 1 sets - 1 reps - 5 min  hold - Standing Shoulder and Trunk Flexion at Table  - 2 x daily - 7 x weekly - 1 sets - 5 reps - 5-10 sec  hold - Supine Chest Stretch on Foam Roll  - 2 x daily - 7 x weekly - 1 sets - 1 reps - 2-5 min  sec  hold - Seated Shoulder Flexion AAROM with Pulley Behind  - 2 x daily - 7 x weekly - 1 sets - 10 reps - 10 sec  hold - Seated Shoulder Scaption AAROM with Pulley at Side  - 2 x daily - 7 x weekly - 1 sets - 10 reps - 10sec  hold - Standing Shoulder Row Reactive Isometric  - 2 x daily - 7 x weekly - 1 sets - 10 reps - 30-45 sec  hold - Shoulder External Rotation Reactive Isometrics  - 1 x daily - 7 x weekly - 1 sets - 10 reps - 3-5 sec  hold - Shoulder Internal Rotation Reactive Isometrics  - 2 x daily - 7 x weekly - 1 sets - 10 reps - 3-5 sec  hold - Seated Shoulder Flexion Slide at Table Top with Forearm in Neutral (Mirrored)  - 1 x daily - 7 x weekly - 1 sets - 10 reps - Seated Shoulder Scaption Slide at Table Top with Forearm in Neutral (Mirrored)  - 1 x daily - 7 x weekly - 1 sets - 10 reps - Standing Bilateral Low Shoulder Row with Anchored Resistance  - 1 x daily - 7 x weekly - 1-3 sets - 10 reps - 2-3 sec  hold - Shoulder  External Rotation with Anchored Resistance  - 1 x daily - 7 x weekly - 1-2 sets - 10 reps - 3 sec  hold - Shoulder Internal Rotation with Resistance  - 1 x daily - 7 x weekly - 2 sets - 10 reps - 3 sec  hold - Shoulder Extension with Resistance  - 1 x daily - 7 x weekly - 2 sets - 10 reps - 3 sec  hold - Standing Wall [employer] with Mini Swiss Ball  - 1 x daily - 7 x weekly - 1 sets - Standing Wall [employer] in Scaption with Mini Swiss Ball  - 1 x daily - 7 x weekly - 1 sets ASSESSMENT:  CLINICAL IMPRESSION: Some  persistent pain or discomfort in the Rt shoulder. Focus more on joint mobs and PROM by PT. Progressing well with increasing active and passive ROM Rt shoulder. Continued to work on improving shoulder ROM with PROM and AAROM per protocol. Joint mobility continues to improve with positive response to joint mobs and manual work at last visit. Progressing gradually toward stated goals of therapy   OBJECTIVE IMPAIRMENTS: s/p Rt RCR 08/22/22 following a fall ~ 6 weeks before with injury to Rt shoulder. He had a previous Rt RCR ~  8 years ago. Patient presents with Rt UE in abduction sling. He has limited functional ability with Rt UE; decreased ROM, strength, function Rt UE; continued edema Rt shoulder; poor posture and alignment; decreased scapular stability/strength.decreased activity tolerance, decreased mobility, decreased ROM, decreased strength, hypomobility, increased edema, increased fascial restrictions, impaired flexibility, impaired UE functional use, improper body mechanics, postural dysfunction, and pain.    GOALS: Goals reviewed with patient? Yes  SHORT TERM GOALS: Target date: 10/10/2022  Increase AROM Rt shoulder to 90 deg elevation Baseline: Goal status: IN PROGRESS  2.  Independent in initial HEP  Baseline:  Goal status: MET  3.  Review and progress with post op precautions, progressing as protocol dictates  Baseline:  Goal status: MET   LONG TERM GOALS:  Target date: 11/21/2022  Improve posture and alignment with patient to demonstrate improved upright posture with posterior shoulder girdle engaged  Baseline:  Goal status: INITIAL  2.  Increase AROM Rt shoulder to WFL's in all planes  Baseline:  Goal status: INITIAL  3.  4/5 to 5/5 strength Rt shoulder  Baseline:  Goal status: INITIAL  4.  Patient reports ability to return to functional activities using Rt UE including dressing, grooming, yard work, ADL's  Baseline:  Goal status: INITIAL  5.  Independent HEP (including aquatic program as indicated) Baseline:  Goal status: INITIAL  6.  Improve functional limitation score to 54 Baseline: 4 Goal status: INITIAL  PLAN:  PT FREQUENCY: 2x/week  PT DURATION: 12 weeks  PLANNED INTERVENTIONS: Therapeutic exercises, Therapeutic activity, Neuromuscular re-education, Patient/Family education, Self Care, Joint mobilization, Aquatic Therapy, Dry Needling, Electrical stimulation, Cryotherapy, Moist heat, Taping, Vasopneumatic device, Ultrasound, Ionotophoresis 4mg /ml Dexamethasone, Manual therapy, and Re-evaluation  PLAN FOR NEXT SESSION: review and progress exercises per protocol; manual work, DN, modalities as indicated; progressing exercises as indicated per protocol    Val Riles, PT 10/24/2022, 10:17 AM

## 2022-10-26 ENCOUNTER — Ambulatory Visit: Payer: Medicare Other | Admitting: Physical Therapy

## 2022-10-26 ENCOUNTER — Encounter: Payer: Self-pay | Admitting: Physical Therapy

## 2022-10-26 DIAGNOSIS — M25511 Pain in right shoulder: Secondary | ICD-10-CM

## 2022-10-26 DIAGNOSIS — M25611 Stiffness of right shoulder, not elsewhere classified: Secondary | ICD-10-CM

## 2022-10-26 DIAGNOSIS — R293 Abnormal posture: Secondary | ICD-10-CM

## 2022-10-26 DIAGNOSIS — R29898 Other symptoms and signs involving the musculoskeletal system: Secondary | ICD-10-CM

## 2022-10-26 DIAGNOSIS — M6281 Muscle weakness (generalized): Secondary | ICD-10-CM

## 2022-10-26 NOTE — Therapy (Signed)
OUTPATIENT PHYSICAL THERAPY SHOULDER TREATMENT   Patient Name: Johnny Riedel MRN: [id number] DOB:1956/02/07, 67 y.o., male Today's Date: 10/26/2022  END OF SESSION:  PT End of Session - 10/26/22 1018     Visit Number 18    Number of Visits 24    Date for PT Re-Evaluation 11/21/22    Authorization Type 20    Progress Note Due on Visit 20    PT Start Time 1016    PT Stop Time 1100    PT Time Calculation (min) 44 min    Activity Tolerance Patient tolerated treatment well             History reviewed. No pertinent past medical history. Past Surgical History:  Procedure Laterality Date   left total hip arthroplasty Left 06/03/2019   There are no problems to display for this patient.   PCP: Dr Lasandra Beech  REFERRING PROVIDER: Shon Baton, PA-C; Dr Theophilus Bones  REFERRING DIAG: Rt RCR   THERAPY DIAG:  Acute pain of right shoulder  Stiffness of right shoulder, not elsewhere classified  Muscle weakness (generalized)  Other symptoms and signs involving the musculoskeletal system  Abnormal posture  Rationale for Evaluation and Treatment: Rehabilitation  ONSET DATE: 08/22/22  SUBJECTIVE:                                                                                                                                                                                      SUBJECTIVE STATEMENT: Pt states he saw surgeon who was pleased with progress. Will see him again in a month. Has been working in the yard, putting and chipping.   EVAL: Patient reports that he fell off a ladder ~ 7 weeks ago with injury to Rt shoulder. Underwent Rt RCR arthroscopically 08/22/22 with some continued bleeding following surgery. No longer bleeding (stopped Saturday 08/26/22). In abduction sling except for dressing.  Hand dominance: Right  PERTINENT HISTORY: Rt RCR ~ 8 years ago; Lt THA 5 yrs ago; lumbar surgery 15 yrs ago; history of chronic LBP; asthma; arthritis; sleep apnea  PAIN:   Are you having pain? Yes: NPRS scale: 4-5/10 Pain location: shoulder and biceps Pain description: dull aching  Aggravating factors: moving and getting in and out of the sling  Relieving factors: meds; ice  PRECAUTIONS: Shoulder- post op protocol  WEIGHT BEARING RESTRICTIONS: Yes no wt bearing Rt UE   FALLS:  Has patient fallen in last 6 months? Yes. 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   UPPER EXTREMITY ROM:   Active/Passive ROM Right Eval Passive  Sitting  Right  09/21/22 Passive  Supine  Right  10/17/22 Active  Standing  Right  10/24/22 Active  Standing  Left Eval AROM Standing    Shoulder flexion 75 deg 112 115 115 136   Shoulder extension   45 52 45   Shoulder abduction   63 72 140   Shoulder adduction        Shoulder internal rotation   Hand to lower sacrum  Hand to sacrum  Thumb &9 pain   Shoulder external rotation   50 Shoulder in neutral at side  64  Shoulder in neutral at side  90 shoulder at 90 deg, elbow 90 deg    Elbow flexion        Elbow extension        Wrist flexion        Wrist extension        Wrist ulnar deviation        Wrist radial deviation        Wrist pronation        Wrist supination        (Blank rows = not tested) Cervical ROM - tight end ranges greatest with Lt lateral flexion and Lt rotation   UPPER EXTREMITY MMT:  MMT Right eval Left eval  Shoulder flexion    Shoulder extension    Shoulder abduction    Shoulder  adduction    Shoulder internal rotation    Shoulder external rotation    Middle trapezius    Lower trapezius    Elbow flexion    Elbow extension    Wrist flexion    Wrist extension    Wrist ulnar deviation    Wrist radial deviation    Wrist pronation    Wrist supination    Grip strength (lbs)    (Blank rows = not tested)  PALPATION: muscular tightness Rt shoulder girdle   [facility] Adult PT Treatment:                                                DATE: 10/26/22 Therapeutic Exercise: Seated Pulleys: flexion x 10 sec x 10 reps; scaption x 10 sec x 10 reps; shoulder horizontal abd x 1 min Standing shoulder flexion wall slide x10 Standing shoulder scaption wall slide x10 Standing "windshield wiper" wall slide x10 Supine shoulder flexion x10, with 1# x10 Sidelying shoulder scaption 2x10 AAROM with PT assist Sidelying open/close book 2x10 Serratus anterior red TB x10 Pendulums x10 CW & CCW Standing Row green TB 3 sec x 10 "W" reactive iso yellow TB 3 sec x10  ER green TB 3 sec x 10 IR green TB 3 sec x 10  Manual Therapy: Soft tissue mobilization Rt upper quarter patient Lt sidelying and supine  Joint mobs Rt GH joint manually PROM Rt shoulder flexion, scaption within tissue tolerance - pt supine Modalities: Moist heat x 10 min    [facility] Adult PT Treatment:  DATE: 10/24/22 Therapeutic Exercise: Seated Pulleys: flexion x 10 sec x 10 reps; scaption x 10 sec x 10 reps; shoulder horizontal abd x 1 min Scap squeeze 2x10 with coregeous ball Joint mobs in sitting arm supported on table for inferior glide  Standing UBE L4 x 3 min alt fwd/back Doorway stretch low x30 sec Row green TB 3 sec x 10 x 2  ER green TB 3 sec x 10 x 2  IR green TB 3 sec x 10 x 2  Abduction green TB 3 sec 10 x 2  Ball on wall 2 min fwd flexion moving ball up down, side to side, circles CW/CCW  Shoulder flexion ball on wall rolling ball into flexion 5-10 sec hold x  10  Shoulder flexion stretch hands on counter stepping back 30 sec x 3 Supine  Prone Shoulder extension 5 sec hold x 10  Row 5 sec x 10  Sidelying ER 5 sec x 10  Manual Therapy: Soft tissue mobilization Rt upper quarter patient Lt sidelying and supine  Joint mobs Rt GH joint manually and using pillow case for mobs sitting and supine PROM Rt shoulder flexion, scaption within tissue tolerance - pt supine Modalities: Moist heat x 10 min  [facility] Adult PT Treatment:                                                DATE: 10/19/22 Therapeutic Exercise: Seated Pulleys: flexion x 10 sec x 10 reps; scaption x 10 sec x 10 reps; shoulder horizontal abd x 1 min Scap squeeze 2x10 with coregeous ball Joint mobs in sitting arm supported on table for inferior glide  Standing UBE L4 x 3 min alt fwd/back Doorway stretch low x30 sec Row green TB 3 sec x 10 x 2  ER green TB 3 sec x 10 x 2  IR green TB 3 sec x 10 x 2  Abduction green TB 3 sec 10 x 2  Ball on wall 2 min fwd flexion moving ball up down, side to side, circles CW/CCW  Shoulder flexion stretch hands on counter stepping back 30 sec x 3 Manual Therapy: Soft tissue mobilization Rt upper quarter patient Lt sidelying and supine  Joint mobs Rt GH joint manually and using pillow case for mobs sitting and supine PROM Rt shoulder flexion, scaption within tissue tolerance - pt supine Modalities: Vaso R shoulder 34 deg, med pressure, 10 min   DATE: 10/03/22 Trigger Point Dry-Needling  Treatment instructions: Expect mild to moderate muscle soreness. S/S of pneumothorax if dry needled over a lung field, and to seek immediate medical attention should they occur. Patient verbalized understanding of these instructions and education.  Patient Consent Given: Yes Education handout provided: Yes Muscles treated: Pecs Electrical stimulation performed: No Parameters: N/A Treatment response/outcome: twitch response, increased muscle length  PATIENT  EDUCATION: Education details: POC; HEP  Person educated: Patient Education method: Programmer, multimedia, Facilities manager, Actor cues, Verbal cues, and Handouts Education comprehension: verbalized understanding, returned demonstration, verbal cues required, tactile cues required, and needs further education  HOME EXERCISE PROGRAM:  Access Code: [email or web address] URL: [email or web address] Date: 10/17/2022 Prepared by: Corlis Leak  Exercises - Circular Shoulder Pendulum with Table Support  - 3-4 x daily - 7 x weekly - 1 sets - 20-30 reps - Seated Cervical Retraction  - 3 x daily - 7 x weekly - 1 sets -  10 reps - Standing Scapular Retraction  - 3 x daily - 7 x weekly - 1 sets - 10 reps - 10 hold - Standing Infraspinatus/Teres Minor Release with Ball at Wall  - 2 x daily - 7 x weekly - Standing Pectoral Release with Shoulder Abduction with Ball at Wall  - 2-3 x daily - 7 x weekly - Seated Cervical Rotation AROM  - 2 x daily - 7 x weekly - 1 sets - 5 reps - 2-3 sec  hold - Seated Cervical Sidebending AROM  - 2 x daily - 7 x weekly - 1 sets - 5 reps - 5-10 sec  hold - Isometric Shoulder Abduction at Wall  - 2 x daily - 7 x weekly - 1 sets - 5-10 reps - 5 sec  hold - Isometric Shoulder External Rotation at Wall  - 2 x daily - 7 x weekly - 1 sets - 5 reps - 5 sec  hold - Isometric Shoulder Extension at Wall  - 2 x daily - 7 x weekly - 1 sets - 5-10 reps - 5 sec  hold - Standing Isometric Shoulder Internal Rotation with Towel Roll at Doorway  - 2 x daily - 7 x weekly - 1 sets - 5 reps - 5 sec  hold - Shoulder Scar Massage  - 2 x daily - 7 x weekly - 1 sets - 1 reps - 5 min  hold - Standing Shoulder and Trunk Flexion at Table  - 2 x daily - 7 x weekly - 1 sets - 5 reps - 5-10 sec  hold - Supine Chest Stretch on Foam Roll  - 2 x daily - 7 x weekly - 1 sets - 1 reps - 2-5 min  sec  hold - Seated Shoulder Flexion AAROM with Pulley Behind  - 2 x daily - 7 x weekly - 1 sets - 10 reps - 10 sec  hold - Seated  Shoulder Scaption AAROM with Pulley at Side  - 2 x daily - 7 x weekly - 1 sets - 10 reps - 10sec  hold - Standing Shoulder Row Reactive Isometric  - 2 x daily - 7 x weekly - 1 sets - 10 reps - 30-45 sec  hold - Shoulder External Rotation Reactive Isometrics  - 1 x daily - 7 x weekly - 1 sets - 10 reps - 3-5 sec  hold - Shoulder Internal Rotation Reactive Isometrics  - 2 x daily - 7 x weekly - 1 sets - 10 reps - 3-5 sec  hold - Seated Shoulder Flexion Slide at Table Top with Forearm in Neutral (Mirrored)  - 1 x daily - 7 x weekly - 1 sets - 10 reps - Seated Shoulder Scaption Slide at Table Top with Forearm in Neutral (Mirrored)  - 1 x daily - 7 x weekly - 1 sets - 10 reps - Standing Bilateral Low Shoulder Row with Anchored Resistance  - 1 x daily - 7 x weekly - 1-3 sets - 10 reps - 2-3 sec  hold - Shoulder External Rotation with Anchored Resistance  - 1 x daily - 7 x weekly - 1-2 sets - 10 reps - 3 sec  hold - Shoulder Internal Rotation with Resistance  - 1 x daily - 7 x weekly - 2 sets - 10 reps - 3 sec  hold - Shoulder Extension with Resistance  - 1 x daily - 7 x weekly - 2 sets - 10 reps - 3 sec  hold - Standing Wall [employer] with [employer]  - 1 x daily - 7 x weekly - 1 sets - Standing Wall [employer] in Scaption with Mini Swiss Ball  - 1 x daily - 7 x weekly - 1 sets ASSESSMENT:  CLINICAL IMPRESSION: Worked on continued shoulder strengthening and improving ROM. Remains most limited with abd/scaption with tight pecs and lats. Improving gradually towards his goals.    OBJECTIVE IMPAIRMENTS: s/p Rt RCR 08/22/22 following a fall ~ 6 weeks before with injury to Rt shoulder. He had a previous Rt RCR ~ 8 years ago. Patient presents with Rt UE in abduction sling. He has limited functional ability with Rt UE; decreased ROM, strength, function Rt UE; continued edema Rt shoulder; poor posture and alignment; decreased scapular stability/strength.decreased activity tolerance, decreased mobility,  decreased ROM, decreased strength, hypomobility, increased edema, increased fascial restrictions, impaired flexibility, impaired UE functional use, improper body mechanics, postural dysfunction, and pain.    GOALS: Goals reviewed with patient? Yes  SHORT TERM GOALS: Target date: 10/10/2022  Increase AROM Rt shoulder to 90 deg elevation Baseline: Goal status: IN PROGRESS  2.  Independent in initial HEP  Baseline:  Goal status: MET  3.  Review and progress with post op precautions, progressing as protocol dictates  Baseline:  Goal status: MET   LONG TERM GOALS: Target date: 11/21/2022  Improve posture and alignment with patient to demonstrate improved upright posture with posterior shoulder girdle engaged  Baseline:  Goal status: INITIAL  2.  Increase AROM Rt shoulder to WFL's in all planes  Baseline:  Goal status: INITIAL  3.  4/5 to 5/5 strength Rt shoulder  Baseline:  Goal status: INITIAL  4.  Patient reports ability to return to functional activities using Rt UE including dressing, grooming, yard work, ADL's  Baseline:  Goal status: INITIAL  5.  Independent HEP (including aquatic program as indicated) Baseline:  Goal status: INITIAL  6.  Improve functional limitation score to 54 Baseline: 4 Goal status: INITIAL  PLAN:  PT FREQUENCY: 2x/week  PT DURATION: 12 weeks  PLANNED INTERVENTIONS: Therapeutic exercises, Therapeutic activity, Neuromuscular re-education, Patient/Family education, Self Care, Joint mobilization, Aquatic Therapy, Dry Needling, Electrical stimulation, Cryotherapy, Moist heat, Taping, Vasopneumatic device, Ultrasound, Ionotophoresis 4mg /ml Dexamethasone, Manual therapy, and Re-evaluation  PLAN FOR NEXT SESSION: review and progress exercises per protocol; manual work, DN, modalities as indicated; progressing exercises as indicated per protocol    Tiki Tucciarone April Ma L Aris Moman, PT 10/26/2022, 10:18 AM

## 2022-10-31 ENCOUNTER — Ambulatory Visit: Payer: Medicare Other | Attending: Orthopaedic Surgery | Admitting: Rehabilitative and Restorative Service Providers"

## 2022-10-31 ENCOUNTER — Encounter: Payer: Self-pay | Admitting: Rehabilitative and Restorative Service Providers"

## 2022-10-31 DIAGNOSIS — R293 Abnormal posture: Secondary | ICD-10-CM | POA: Insufficient documentation

## 2022-10-31 DIAGNOSIS — M6281 Muscle weakness (generalized): Secondary | ICD-10-CM | POA: Diagnosis present

## 2022-10-31 DIAGNOSIS — M25511 Pain in right shoulder: Secondary | ICD-10-CM | POA: Diagnosis present

## 2022-10-31 DIAGNOSIS — R29898 Other symptoms and signs involving the musculoskeletal system: Secondary | ICD-10-CM | POA: Insufficient documentation

## 2022-10-31 DIAGNOSIS — M25611 Stiffness of right shoulder, not elsewhere classified: Secondary | ICD-10-CM | POA: Insufficient documentation

## 2022-10-31 NOTE — Therapy (Signed)
OUTPATIENT PHYSICAL THERAPY SHOULDER TREATMENT   Patient Name: Johnny Arnold MRN: [id number] DOB:April 12, 1956, 67 y.o., male Today's Date: 10/31/2022  END OF SESSION:  PT End of Session - 10/31/22 1153     Visit Number 19    Number of Visits 24    Date for PT Re-Evaluation 11/21/22    Authorization Type 20    Progress Note Due on Visit 20    PT Start Time 1144    PT Stop Time 1232    PT Time Calculation (min) 48 min    Activity Tolerance Patient tolerated treatment well             History reviewed. No pertinent past medical history. Past Surgical History:  Procedure Laterality Date   left total hip arthroplasty Left 06/03/2019   There are no problems to display for this patient.   PCP: Dr Lasandra Beech  REFERRING PROVIDER: Shon Baton, PA-C; Dr Theophilus Bones  REFERRING DIAG: Rt RCR   THERAPY DIAG:  Acute pain of right shoulder  Stiffness of right shoulder, not elsewhere classified  Muscle weakness (generalized)  Other symptoms and signs involving the musculoskeletal system  Abnormal posture  Rationale for Evaluation and Treatment: Rehabilitation  ONSET DATE: 08/22/22  SUBJECTIVE:                                                                                                                                                                                      SUBJECTIVE STATEMENT: Pt reports that he has some continued pain in the Rt shoulder which is different on different days. He is using Rt UE for most functional activities but is avoiding lifting anything heavy. Has been working in the yard, putting and chipping. Will be out of town for the next couple of weeks on vacation.   EVAL: Patient reports that he fell off a ladder ~ 7 weeks ago with injury to Rt shoulder. Underwent Rt RCR arthroscopically 08/22/22 with some continued bleeding following surgery. No longer bleeding (stopped Saturday 08/26/22). In abduction sling except for dressing.  Hand dominance:  Right  PERTINENT HISTORY: Rt RCR ~ 8 years ago; Lt THA 5 yrs ago; lumbar surgery 15 yrs ago; history of chronic LBP; asthma; arthritis; sleep apnea  PAIN:  Are you having pain? Yes: NPRS scale: 4-5/10 Pain location: shoulder and biceps Pain description: dull aching  Aggravating factors: moving and getting in and out of the sling  Relieving factors: meds; ice  PRECAUTIONS: Shoulder- post op protocol  WEIGHT BEARING RESTRICTIONS: Yes no wt bearing Rt UE   FALLS:  Has patient fallen in last 6 months? Yes. 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   UPPER EXTREMITY ROM:   Active/Passive ROM Right Eval Passive  Sitting  Right  09/21/22 Passive  Supine  Right  10/17/22 Active  Standing  Right  10/24/22 Active  Standing  Left Eval AROM Standing    Shoulder flexion 75 deg 112 115 115 136   Shoulder extension   45 52 45   Shoulder abduction   63 72 140   Shoulder adduction        Shoulder internal rotation   Hand to lower sacrum  Hand to sacrum  Thumb &9 pain   Shoulder external rotation   50 Shoulder in neutral at side  64  Shoulder in neutral at side  90 shoulder at 90 deg, elbow 90 deg    Elbow flexion        Elbow extension        Wrist flexion        Wrist extension        Wrist ulnar deviation        Wrist radial deviation        Wrist pronation        Wrist supination        (Blank rows = not tested) Cervical ROM - tight end ranges greatest with Lt lateral  flexion and Lt rotation   UPPER EXTREMITY MMT:  MMT Right eval Left eval  Shoulder flexion    Shoulder extension    Shoulder abduction    Shoulder adduction    Shoulder internal rotation    Shoulder external rotation    Middle trapezius    Lower trapezius    Elbow flexion    Elbow extension    Wrist flexion    Wrist extension    Wrist ulnar deviation    Wrist radial deviation    Wrist pronation    Wrist supination    Grip strength (lbs)    (Blank rows = not tested)  PALPATION: muscular tightness Rt shoulder girdle   [facility] Adult PT Treatment:                                                DATE: 10/31/22 Therapeutic Exercise: Seated Pulleys: flexion x 10 sec x 10 reps; scaption x 10 sec x 10 reps; shoulder horizontal abd x 1 min UBE L8 x 4 min alt fwd/back Doorway stretch 3 positions x 2 reps each position  T at wall holding position for stretch 20 sec x 3   Manual Therapy: Soft tissue mobilization Rt upper quarter patient Lt sidelying and supine  Joint mobs Rt GH joint manually PROM Rt shoulder flexion, scaption within tissue tolerance - pt supine Trigger Point Dry-Needling  Treatment instructions: Expect mild to moderate muscle soreness. S/S of pneumothorax if dry needled over a lung field, and to seek immediate medical attention should they occur. Patient verbalized understanding of these instructions  and education.  Patient Consent Given: Yes Education handout provided: Previously provided Muscles treated: infraspinatus; teres; triceps tendon area; upper trap  Electrical stimulation performed: Yes Parameters:  mAmp current intensity adjusted to patient tolerance  Treatment response/outcome: decreased palpable tightness  Modalities: Moist heat x 10 min  [facility] Adult PT Treatment:                                                DATE: 10/26/22 Therapeutic Exercise: Seated Pulleys: flexion x 10 sec x 10 reps; scaption x 10 sec x 10 reps; shoulder horizontal abd x 1  min Standing shoulder flexion wall slide x10 Standing shoulder scaption wall slide x10 Standing "windshield wiper" wall slide x10 Supine shoulder flexion x10, with 1# x10 Sidelying shoulder scaption 2x10 AAROM with PT assist Sidelying open/close book 2x10 Serratus anterior red TB x10 Pendulums x10 CW & CCW Standing Row green TB 3 sec x 10 "W" reactive iso yellow TB 3 sec x10  ER green TB 3 sec x 10 IR green TB 3 sec x 10  Manual Therapy: Soft tissue mobilization Rt upper quarter patient Lt sidelying and supine  Joint mobs Rt GH joint manually PROM Rt shoulder flexion, scaption within tissue tolerance - pt supine Modalities: Moist heat x 10 min   PATIENT EDUCATION: Education details: POC; HEP  Person educated: Patient Education method: Programmer, multimedia, Demonstration, Tactile cues, Verbal cues, and Handouts Education comprehension: verbalized understanding, returned demonstration, verbal cues required, tactile cues required, and needs further education  HOME EXERCISE PROGRAM: Access Code: [email or web address] URL: [email or web address] Date: 10/31/2022 Prepared by: Corlis Leak  Exercises - Circular Shoulder Pendulum with Table Support  - 3-4 x daily - 7 x weekly - 1 sets - 20-30 reps - Seated Cervical Retraction  - 3 x daily - 7 x weekly - 1 sets - 10 reps - Standing Scapular Retraction  - 3 x daily - 7 x weekly - 1 sets - 10 reps - 10 hold - Standing Infraspinatus/Teres Minor Release with Ball at Wall  - 2 x daily - 7 x weekly - Standing Pectoral Release with Shoulder Abduction with Ball at Wall  - 2-3 x daily - 7 x weekly - Seated Cervical Rotation AROM  - 2 x daily - 7 x weekly - 1 sets - 5 reps - 2-3 sec  hold - Seated Cervical Sidebending AROM  - 2 x daily - 7 x weekly - 1 sets - 5 reps - 5-10 sec  hold - Isometric Shoulder Abduction at Wall  - 2 x daily - 7 x weekly - 1 sets - 5-10 reps - 5 sec  hold - Isometric Shoulder External Rotation at Wall  - 2 x daily - 7 x weekly -  1 sets - 5 reps - 5 sec  hold - Isometric Shoulder Extension at Wall  - 2 x daily - 7 x weekly - 1 sets - 5-10 reps - 5 sec  hold - Standing Isometric Shoulder Internal Rotation with Towel Roll at Doorway  - 2 x daily - 7 x weekly - 1 sets - 5 reps - 5 sec  hold - Shoulder Scar Massage  - 2 x daily - 7 x weekly - 1 sets - 1 reps - 5 min  hold - Standing Shoulder and Trunk Flexion at Table  - 2 x daily - 7 x  weekly - 1 sets - 5 reps - 5-10 sec  hold - Supine Chest Stretch on Foam Roll  - 2 x daily - 7 x weekly - 1 sets - 1 reps - 2-5 min  sec  hold - Seated Shoulder Flexion AAROM with Pulley Behind  - 2 x daily - 7 x weekly - 1 sets - 10 reps - 10 sec  hold - Seated Shoulder Scaption AAROM with Pulley at Side  - 2 x daily - 7 x weekly - 1 sets - 10 reps - 10sec  hold - Standing Shoulder Row Reactive Isometric  - 2 x daily - 7 x weekly - 1 sets - 10 reps - 30-45 sec  hold - Shoulder External Rotation Reactive Isometrics  - 1 x daily - 7 x weekly - 1 sets - 10 reps - 3-5 sec  hold - Shoulder Internal Rotation Reactive Isometrics  - 2 x daily - 7 x weekly - 1 sets - 10 reps - 3-5 sec  hold - Seated Shoulder Flexion Slide at Table Top with Forearm in Neutral (Mirrored)  - 1 x daily - 7 x weekly - 1 sets - 10 reps - Seated Shoulder Scaption Slide at Table Top with Forearm in Neutral (Mirrored)  - 1 x daily - 7 x weekly - 1 sets - 10 reps - Standing Bilateral Low Shoulder Row with Anchored Resistance  - 1 x daily - 7 x weekly - 1-3 sets - 10 reps - 2-3 sec  hold - Shoulder External Rotation with Anchored Resistance  - 1 x daily - 7 x weekly - 1-2 sets - 10 reps - 3 sec  hold - Shoulder Internal Rotation with Resistance  - 1 x daily - 7 x weekly - 2 sets - 10 reps - 3 sec  hold - Shoulder Extension with Resistance  - 1 x daily - 7 x weekly - 2 sets - 10 reps - 3 sec  hold - Standing Wall [employer] with Mini Swiss Ball  - 1 x daily - 7 x weekly - 1 sets - Standing Wall [employer] in Scaption with Mini  Swiss Ball  - 1 x daily - 7 x weekly - 1 sets - Doorway Pec Stretch at 60 Degrees Abduction  - 3 x daily - 7 x weekly - 1 sets - 3 reps - Doorway Pec Stretch at 90 Degrees Abduction  - 3 x daily - 7 x weekly - 1 sets - 3 reps - 30 seconds  hold - Doorway Pec Stretch at 120 Degrees Abduction  - 3 x daily - 7 x weekly - 1 sets - 3 reps - 30 second hold  hold  ASSESSMENT:  CLINICAL IMPRESSION: Focus on manual work including DN, STM, joint mobs, PROM with patient in prone, Lt sidelying, supine. Note muscular tightness in the infraspinatus, teres, pecs; upper trap. Good release with treatment. Added pec stretch in doorway and T at wall to patient tolerance.     OBJECTIVE IMPAIRMENTS: s/p Rt RCR 08/22/22 following a fall ~ 6 weeks before with injury to Rt shoulder. He had a previous Rt RCR ~ 8 years ago. Patient presents with Rt UE in abduction sling. He has limited functional ability with Rt UE; decreased ROM, strength, function Rt UE; continued edema Rt shoulder; poor posture and alignment; decreased scapular stability/strength.decreased activity tolerance, decreased mobility, decreased ROM, decreased strength, hypomobility, increased edema, increased fascial restrictions, impaired flexibility, impaired UE functional use, improper body mechanics, postural  dysfunction, and pain.    GOALS: Goals reviewed with patient? Yes  SHORT TERM GOALS: Target date: 10/10/2022  Increase AROM Rt shoulder to 90 deg elevation Baseline: Goal status: IN PROGRESS  2.  Independent in initial HEP  Baseline:  Goal status: MET  3.  Review and progress with post op precautions, progressing as protocol dictates  Baseline:  Goal status: MET   LONG TERM GOALS: Target date: 11/21/2022  Improve posture and alignment with patient to demonstrate improved upright posture with posterior shoulder girdle engaged  Baseline:  Goal status: INITIAL  2.  Increase AROM Rt shoulder to WFL's in all planes  Baseline:  Goal  status: INITIAL  3.  4/5 to 5/5 strength Rt shoulder  Baseline:  Goal status: INITIAL  4.  Patient reports ability to return to functional activities using Rt UE including dressing, grooming, yard work, ADL's  Baseline:  Goal status: INITIAL  5.  Independent HEP (including aquatic program as indicated) Baseline:  Goal status: INITIAL  6.  Improve functional limitation score to 54 Baseline: 4 Goal status: INITIAL  PLAN:  PT FREQUENCY: 2x/week  PT DURATION: 12 weeks  PLANNED INTERVENTIONS: Therapeutic exercises, Therapeutic activity, Neuromuscular re-education, Patient/Family education, Self Care, Joint mobilization, Aquatic Therapy, Dry Needling, Electrical stimulation, Cryotherapy, Moist heat, Taping, Vasopneumatic device, Ultrasound, Ionotophoresis 4mg /ml Dexamethasone, Manual therapy, and Re-evaluation  PLAN FOR NEXT SESSION: review and progress exercises per protocol; manual work, DN, modalities as indicated; progressing exercises as indicated per protocol    Val Riles, PT 10/31/2022, 12:44 PM

## 2022-11-01 ENCOUNTER — Ambulatory Visit: Payer: Medicare Other | Admitting: Physical Therapy

## 2022-11-02 ENCOUNTER — Encounter: Payer: Medicare Other | Admitting: Rehabilitative and Restorative Service Providers"

## 2022-11-14 ENCOUNTER — Ambulatory Visit: Payer: Medicare Other | Admitting: Rehabilitative and Restorative Service Providers"

## 2022-11-14 ENCOUNTER — Encounter: Payer: Self-pay | Admitting: Rehabilitative and Restorative Service Providers"

## 2022-11-14 DIAGNOSIS — M6281 Muscle weakness (generalized): Secondary | ICD-10-CM

## 2022-11-14 DIAGNOSIS — R29898 Other symptoms and signs involving the musculoskeletal system: Secondary | ICD-10-CM

## 2022-11-14 DIAGNOSIS — M25511 Pain in right shoulder: Secondary | ICD-10-CM

## 2022-11-14 DIAGNOSIS — M25611 Stiffness of right shoulder, not elsewhere classified: Secondary | ICD-10-CM

## 2022-11-14 DIAGNOSIS — R293 Abnormal posture: Secondary | ICD-10-CM

## 2022-11-14 NOTE — Therapy (Signed)
OUTPATIENT PHYSICAL THERAPY SHOULDER TREATMENT AND [employer] [employer] PROGRESS NOTE   Progress Note Reporting Period 08/29/22 to 11/14/22  See note below for Objective Data and Assessment of Progress/Goals.       Patient Name: Johnny Arnold MRN: [id number] DOB:Feb 13, 1956, 67 y.o., male Today's Date: 11/14/2022  END OF SESSION:  PT End of Session - 11/14/22 0802     Visit Number 20    Number of Visits 24    Date for PT Re-Evaluation 11/21/22    Authorization Type 20    Progress Note Due on Visit 20    PT Start Time 0800    PT Stop Time 0848    PT Time Calculation (min) 48 min    Activity Tolerance Patient tolerated treatment well             History reviewed. No pertinent past medical history. Past Surgical History:  Procedure Laterality Date   left total hip arthroplasty Left 06/03/2019   There are no problems to display for this patient.   PCP: Dr Lasandra Beech  REFERRING PROVIDER: Shon Baton, PA-C; Dr Theophilus Bones  REFERRING DIAG: Rt RCR   THERAPY DIAG:  Acute pain of right shoulder  Stiffness of right shoulder, not elsewhere classified  Muscle weakness (generalized)  Other symptoms and signs involving the musculoskeletal system  Abnormal posture  Rationale for Evaluation and Treatment: Rehabilitation  ONSET DATE: 08/22/22  SUBJECTIVE:                                                                                                                                                                                      SUBJECTIVE STATEMENT: Pt reports that he has some pain in the Rt shoulder on a constant basis. He can get pain down to 1/2 on 0-10 scale when he gets the arm in a certain position. Most of time pain is 2-3/10 but can go up to 5/10 when he reaches - that will be a sharp pain. His ROM is good and the strength is improving. He can reach overhead now. He is using Rt UE for most functional activities but is avoiding lifting anything heavy. Has been  working in the yard, putting and chipping. He was on vacation last couple of weeks.    EVAL: Patient reports that he fell off a ladder ~ 7 weeks ago with injury to Rt shoulder. Underwent Rt RCR arthroscopically 08/22/22 with some continued bleeding following surgery. No longer bleeding (stopped Saturday 08/26/22). In abduction sling except for dressing.  Hand dominance: Right  PERTINENT HISTORY: Rt RCR ~ 8 years ago; Lt THA 5 yrs ago; lumbar surgery 15 yrs ago; history of chronic LBP; asthma;  arthritis; sleep apnea  PAIN:  Are you having pain? Yes: NPRS scale: 1-2/10 Pain location: shoulder and biceps Pain description: dull aching  Aggravating factors: moving and getting in and out of the sling  Relieving factors: meds; ice  PRECAUTIONS: Shoulder- post op protocol  WEIGHT BEARING RESTRICTIONS: Yes no wt bearing Rt UE   FALLS:  Has patient fallen in last 6 months? Yes. 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   UPPER EXTREMITY ROM:   Active/Passive ROM Right Eval Passive  Sitting  Right  09/21/22 Passive  Supine  Right  10/17/22 Active  Standing  Right  10/24/22 Active  Standing  Left Eval AROM Standing  Right  11/14/22 AROM Standing   Shoulder flexion 75 deg 112 115 115 136 139  Shoulder extension   45 52 45 46  Shoulder abduction   63 72 140 88  Shoulder adduction        Shoulder internal rotation   Hand to lower sacrum  Hand to sacrum  Thumb &9 pain Hand to sacrum   Shoulder external rotation   50 Shoulder in neutral at side  64  Shoulder in neutral at side  90 shoulder at 90 deg, elbow 90 deg  75 shoulder at 90 deg, elbow 90 deg   Elbow flexion        Elbow extension        Wrist flexion        Wrist extension        Wrist ulnar deviation        Wrist radial deviation        Wrist pronation        Wrist supination        (Blank rows = not tested) Cervical ROM - tight end ranges greatest with Lt lateral flexion and Lt rotation   UPPER EXTREMITY MMT:  MMT Right eval Left eval  Shoulder flexion 4 5  Shoulder extension 5 5  Shoulder abduction 4- 5  Shoulder adduction    Shoulder internal rotation 4+ 5  Shoulder external rotation 4+ 5  Middle trapezius    Lower trapezius    Elbow flexion    Elbow extension    Wrist flexion    Wrist extension    Wrist ulnar deviation    Wrist radial deviation    Wrist pronation    Wrist supination    Grip strength (lbs)    (Blank rows = not tested)  PALPATION: muscular tightness Rt shoulder girdle   [facility] Adult PT Treatment:                                                DATE: 11/14/22 Therapeutic Exercise: Seated Pulleys: flexion x 10 sec x 10 reps; scaption x 10 sec x 10 reps; shoulder horizontal abd x 1 min UBE L8 x 4 min alt  fwd/back Doorway stretch 3 positions x 2 reps each position  Overdoor stretch for shoulder flexion 20 sec x 2  T at wall holding position for stretch 20 sec x 3  Shoulder flexion with red-> yellow TB back at wall x 10  Bilat shd flexion 2# DB x 10 cues to keep shoulder down and back   Manual Therapy: Soft tissue mobilization Rt upper quarter patient Lt sidelying and supine  Joint mobs Rt GH joint manually PROM Rt shoulder flexion, scaption within tissue tolerance - pt supine Joint mobs sitting and Lt sidelying  Modalities: Vaso medium pressure 34 deg x 10 min   [facility] Adult PT Treatment:                                                DATE: 10/31/22 Therapeutic  Exercise: Seated Pulleys: flexion x 10 sec x 10 reps; scaption x 10 sec x 10 reps; shoulder horizontal abd x 1 min UBE L8 x 4 min alt fwd/back Doorway stretch 3 positions x 2 reps each position  T at wall holding position for stretch 20 sec x 3   Manual Therapy: Soft tissue mobilization Rt upper quarter patient Lt sidelying and supine  Joint mobs Rt GH joint manually PROM Rt shoulder flexion, scaption within tissue tolerance - pt supine Trigger Point Dry-Needling  Treatment instructions: Expect mild to moderate muscle soreness. S/S of pneumothorax if dry needled over a lung field, and to seek immediate medical attention should they occur. Patient verbalized understanding of these instructions and education.  Patient Consent Given: Yes Education handout provided: Previously provided Muscles treated: infraspinatus; teres; triceps tendon area; upper trap  Electrical stimulation performed: Yes Parameters:  mAmp current intensity adjusted to patient tolerance  Treatment response/outcome: decreased palpable tightness  Modalities: Moist heat x 10 min  [facility] Adult PT Treatment:                                                DATE: 10/26/22 Therapeutic Exercise: Seated Pulleys: flexion x 10 sec x 10 reps; scaption x 10 sec x 10 reps; shoulder horizontal abd x 1 min Standing shoulder flexion wall slide x10 Standing shoulder scaption wall slide x10 Standing "windshield wiper" wall slide x10 Supine shoulder flexion x10, with 1# x10 Sidelying shoulder scaption 2x10 AAROM with PT assist Sidelying open/close book 2x10 Serratus anterior red TB x10 Pendulums x10 CW & CCW Standing Row green TB 3 sec x 10 "W" reactive iso yellow TB 3 sec x10  ER green TB 3 sec x 10 IR green TB 3 sec x 10  Manual Therapy: Soft tissue mobilization Rt upper quarter patient Lt sidelying and supine  Joint mobs Rt GH joint manually PROM Rt shoulder flexion, scaption within tissue tolerance - pt supine Modalities: Moist  heat x 10 min   PATIENT EDUCATION: Education details: POC; HEP  Person educated: Patient Education method: Programmer, multimedia, Demonstration, Tactile cues, Verbal cues, and Handouts Education comprehension: verbalized understanding, returned demonstration, verbal cues required, tactile cues required, and needs further education  HOME EXERCISE PROGRAM: Access Code: [email or web address] URL: [email or web address] Date: 10/31/2022 Prepared by: Corlis Leak  Exercises - Circular Shoulder Pendulum with Table Support  - 3-4 x daily - 7 x weekly -  1 sets - 20-30 reps - Seated Cervical Retraction  - 3 x daily - 7 x weekly - 1 sets - 10 reps - Standing Scapular Retraction  - 3 x daily - 7 x weekly - 1 sets - 10 reps - 10 hold - Standing Infraspinatus/Teres Minor Release with Ball at Wall  - 2 x daily - 7 x weekly - Standing Pectoral Release with Shoulder Abduction with Ball at Wall  - 2-3 x daily - 7 x weekly - Seated Cervical Rotation AROM  - 2 x daily - 7 x weekly - 1 sets - 5 reps - 2-3 sec  hold - Seated Cervical Sidebending AROM  - 2 x daily - 7 x weekly - 1 sets - 5 reps - 5-10 sec  hold - Isometric Shoulder Abduction at Wall  - 2 x daily - 7 x weekly - 1 sets - 5-10 reps - 5 sec  hold - Isometric Shoulder External Rotation at Wall  - 2 x daily - 7 x weekly - 1 sets - 5 reps - 5 sec  hold - Isometric Shoulder Extension at Wall  - 2 x daily - 7 x weekly - 1 sets - 5-10 reps - 5 sec  hold - Standing Isometric Shoulder Internal Rotation with Towel Roll at Doorway  - 2 x daily - 7 x weekly - 1 sets - 5 reps - 5 sec  hold - Shoulder Scar Massage  - 2 x daily - 7 x weekly - 1 sets - 1 reps - 5 min  hold - Standing Shoulder and Trunk Flexion at Table  - 2 x daily - 7 x weekly - 1 sets - 5 reps - 5-10 sec  hold - Supine Chest Stretch on Foam Roll  - 2 x daily - 7 x weekly - 1 sets - 1 reps - 2-5 min  sec  hold - Seated Shoulder Flexion AAROM with Pulley Behind  - 2 x daily - 7 x weekly - 1 sets - 10 reps  - 10 sec  hold - Seated Shoulder Scaption AAROM with Pulley at Side  - 2 x daily - 7 x weekly - 1 sets - 10 reps - 10sec  hold - Standing Shoulder Row Reactive Isometric  - 2 x daily - 7 x weekly - 1 sets - 10 reps - 30-45 sec  hold - Shoulder External Rotation Reactive Isometrics  - 1 x daily - 7 x weekly - 1 sets - 10 reps - 3-5 sec  hold - Shoulder Internal Rotation Reactive Isometrics  - 2 x daily - 7 x weekly - 1 sets - 10 reps - 3-5 sec  hold - Seated Shoulder Flexion Slide at Table Top with Forearm in Neutral (Mirrored)  - 1 x daily - 7 x weekly - 1 sets - 10 reps - Seated Shoulder Scaption Slide at Table Top with Forearm in Neutral (Mirrored)  - 1 x daily - 7 x weekly - 1 sets - 10 reps - Standing Bilateral Low Shoulder Row with Anchored Resistance  - 1 x daily - 7 x weekly - 1-3 sets - 10 reps - 2-3 sec  hold - Shoulder External Rotation with Anchored Resistance  - 1 x daily - 7 x weekly - 1-2 sets - 10 reps - 3 sec  hold - Shoulder Internal Rotation with Resistance  - 1 x daily - 7 x weekly - 2 sets - 10 reps - 3 sec  hold - Shoulder  Extension with Resistance  - 1 x daily - 7 x weekly - 2 sets - 10 reps - 3 sec  hold - Standing Wall [employer] with Mini Swiss Ball  - 1 x daily - 7 x weekly - 1 sets - Standing Wall [employer] in Scaption with Mini Swiss Ball  - 1 x daily - 7 x weekly - 1 sets - Doorway Pec Stretch at 60 Degrees Abduction  - 3 x daily - 7 x weekly - 1 sets - 3 reps - Doorway Pec Stretch at 90 Degrees Abduction  - 3 x daily - 7 x weekly - 1 sets - 3 reps - 30 seconds  hold - Doorway Pec Stretch at 120 Degrees Abduction  - 3 x daily - 7 x weekly - 1 sets - 3 reps - 30 second hold  hold  ASSESSMENT:  CLINICAL IMPRESSION: Focus on manual work including DN, STM, joint mobs, PROM with patient in prone, Lt sidelying, supine. Note muscular tightness in the infraspinatus, teres, pecs; upper trap. Good release with treatment. Added pec stretch in doorway and T at wall to  patient tolerance.     OBJECTIVE IMPAIRMENTS: s/p Rt RCR 08/22/22 following a fall ~ 6 weeks before with injury to Rt shoulder. He had a previous Rt RCR ~ 8 years ago. Patient presents with Rt UE in abduction sling. He has limited functional ability with Rt UE; decreased ROM, strength, function Rt UE; continued edema Rt shoulder; poor posture and alignment; decreased scapular stability/strength.decreased activity tolerance, decreased mobility, decreased ROM, decreased strength, hypomobility, increased edema, increased fascial restrictions, impaired flexibility, impaired UE functional use, improper body mechanics, postural dysfunction, and pain.    GOALS: Goals reviewed with patient? Yes  SHORT TERM GOALS: Target date: 10/10/2022  Increase AROM Rt shoulder to 90 deg elevation Baseline: Goal status: IN PROGRESS  2.  Independent in initial HEP  Baseline:  Goal status: MET  3.  Review and progress with post op precautions, progressing as protocol dictates  Baseline:  Goal status: MET   LONG TERM GOALS: Target date: 11/21/2022  Improve posture and alignment with patient to demonstrate improved upright posture with posterior shoulder girdle engaged  Baseline:  Goal status: on going   2.  Increase AROM Rt shoulder to WFL's in all planes  Baseline:  Goal status:on going   3.  4/5 to 5/5 strength Rt shoulder  Baseline:  Goal status: partially accomplished   4.  Patient reports ability to return to functional activities using Rt UE including dressing, grooming, yard work, ADL's  Baseline:  Goal status: partially accomplished  5.  Independent HEP (including aquatic program as indicated) Baseline:  Goal status: on going   6.  Improve functional limitation score to 54 Baseline: 4 Goal status: on going   PLAN:  PT FREQUENCY: 1-2 x/week  PT DURATION: 12 weeks  PLANNED INTERVENTIONS: Therapeutic exercises, Therapeutic activity, Neuromuscular re-education, Patient/Family  education, Self Care, Joint mobilization, Aquatic Therapy, Dry Needling, Electrical stimulation, Cryotherapy, Moist heat, Taping, Vasopneumatic device, Ultrasound, Ionotophoresis 4mg /ml Dexamethasone, Manual therapy, and Re-evaluation  PLAN FOR NEXT SESSION: review and progress exercises per protocol; manual work, DN, modalities as indicated; progressing exercises as indicated per protocol    Val Riles, PT 11/14/2022, 8:02 AM

## 2022-11-16 ENCOUNTER — Encounter: Payer: Self-pay | Admitting: Rehabilitative and Restorative Service Providers"

## 2022-11-21 ENCOUNTER — Encounter: Payer: Self-pay | Admitting: Rehabilitative and Restorative Service Providers"

## 2022-11-21 ENCOUNTER — Ambulatory Visit: Payer: Medicare Other | Admitting: Rehabilitative and Restorative Service Providers"

## 2022-11-21 DIAGNOSIS — R29898 Other symptoms and signs involving the musculoskeletal system: Secondary | ICD-10-CM

## 2022-11-21 DIAGNOSIS — M25511 Pain in right shoulder: Secondary | ICD-10-CM

## 2022-11-21 DIAGNOSIS — R293 Abnormal posture: Secondary | ICD-10-CM

## 2022-11-21 DIAGNOSIS — M25611 Stiffness of right shoulder, not elsewhere classified: Secondary | ICD-10-CM

## 2022-11-21 DIAGNOSIS — M6281 Muscle weakness (generalized): Secondary | ICD-10-CM

## 2022-11-21 NOTE — Therapy (Signed)
Patient Name: Johnny Arnold MRN: [id number] DOB:Apr 13, 1956, 67 y.o., male Today's Date: 11/21/2022  END OF SESSION:  PT End of Session - 11/21/22 0803     Visit Number 21    Number of Visits 30    Date for PT Re-Evaluation 01/09/23    Authorization Type [employer]    Progress Note Due on Visit 30    PT Start Time 0800    PT Stop Time 0850    PT Time Calculation (min) 50 min    Activity Tolerance Patient tolerated treatment well             History reviewed. No pertinent past medical history. Past Surgical History:  Procedure Laterality Date   left total hip arthroplasty Left 06/03/2019   There are no problems to display for this patient.   PCP: Dr Lasandra Beech  REFERRING PROVIDER: Shon Baton, PA-C; Dr Theophilus Bones  REFERRING DIAG: Rt RCR   THERAPY DIAG:  Acute pain of right shoulder  Stiffness of right shoulder, not elsewhere classified  Muscle weakness (generalized)  Other symptoms and signs involving the musculoskeletal system  Abnormal posture  Rationale for Evaluation and Treatment: Rehabilitation  ONSET DATE: 08/22/22  SUBJECTIVE:                                                                                                                                                                                      SUBJECTIVE STATEMENT: Pt reports that he has some pain in the Rt shoulder on a constant basis but intensity is gradually decreasing. He can get pain down to 1/2 on 0-10 scale when he gets the arm in a certain position. Most of time pain is now 1-2/10 but can go up to 5/10 when he reaches - that will be a sharp pain. His ROM is good and the strength is improving. He can reach overhead now. He is using Rt UE for most functional activities but is avoiding lifting anything heavy. Has been working in the yard, putting and chipping. He was on vacation last couple of weeks.    EVAL: Patient reports that he fell off a ladder ~ 7 weeks ago with  injury to Rt shoulder. Underwent Rt RCR arthroscopically 08/22/22 with some continued bleeding following surgery. No longer bleeding (stopped Saturday 08/26/22). In abduction sling except for dressing.  Hand dominance: Right  PERTINENT HISTORY: Rt RCR ~ 8 years ago; Lt THA 5 yrs ago; lumbar surgery 15 yrs ago; history of chronic LBP; asthma; arthritis; sleep apnea  PAIN:  Are you having pain? Yes: NPRS scale: 1-2/10 Pain location: shoulder and biceps Pain description: dull aching  Aggravating factors: moving  and getting in and out of the sling  Relieving factors: meds; ice  PRECAUTIONS: Shoulder- post op protocol  WEIGHT BEARING RESTRICTIONS: Yes no wt bearing Rt UE   FALLS:  Has patient fallen in last 6 months? Yes. 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   UPPER EXTREMITY ROM:   Active/Passive ROM Right Eval Passive  Sitting  Right  09/21/22 Passive  Supine  Right  10/17/22 Active  Standing  Right  10/24/22 Active  Standing  Left Eval AROM Standing  Right  11/21/22 AROM Standing   Shoulder flexion 75 deg 112 115 115 136 139  Shoulder extension   45 52 45 46  Shoulder abduction   63 72 140 88  Shoulder adduction        Shoulder internal rotation   Hand to lower sacrum  Hand to sacrum  Thumb &9 pain Hand to sacrum  Shoulder external rotation   50 Shoulder in neutral at side  64  Shoulder in neutral at side  90 shoulder at 90 deg, elbow 90 deg  75 shoulder at 90 deg,  elbow 90 deg   Elbow flexion        Elbow extension        Wrist flexion        Wrist extension        Wrist ulnar deviation        Wrist radial deviation        Wrist pronation        Wrist supination        (Blank rows = not tested) Cervical ROM - tight end ranges greatest with Lt lateral flexion and Lt rotation   UPPER EXTREMITY MMT: 11/21/22  MMT Right eval Left eval  Shoulder flexion 4 5  Shoulder extension 5 5  Shoulder abduction 4- 5  Shoulder adduction    Shoulder internal rotation 4+ 5  Shoulder external rotation 4+ 5  Middle trapezius    Lower trapezius    Elbow flexion    Elbow extension    Wrist flexion    Wrist extension    Wrist ulnar deviation    Wrist radial deviation    Wrist pronation    Wrist supination    Grip strength (lbs)    (Blank rows = not tested)  PALPATION: muscular tightness Rt shoulder girdle   [facility] Adult PT Treatment:                                                DATE: 11/21/22 Therapeutic Exercise: UBE L8 x 4 min alt fwd/back Doorway stretch 3 positions x 2 reps each position  Overdoor stretch for shoulder flexion 20 sec x 2  Shoulder flexion stepping under therapy ball  T at wall holding position for stretch 20 sec x 3  Shoulder flexion with red-> yellow TB back at  wall x 10  Bilat shd flexion 2# DB x 10 cues to keep shoulder down and back  ER with UE propped at 80 deg abd for ER yellow TB x 10  Large circles fwd/back   Manual Therapy: Soft tissue mobilization Rt upper quarter patient Lt sidelying and supine  Joint mobs Rt GH joint manually PROM Rt shoulder flexion, scaption within tissue tolerance - pt supine Joint mobs sitting and Lt sidelying  Modalities: Vaso medium pressure 34 deg x 10 min   [facility] Adult PT Treatment:                                                DATE: 11/14/22 Therapeutic Exercise: Seated Pulleys: flexion x 10 sec x 10 reps; scaption x 10 sec x 10 reps; shoulder horizontal abd x 1 min UBE L8 x 4 min alt  fwd/back Doorway stretch 3 positions x 2 reps each position  Overdoor stretch for shoulder flexion 20 sec x 2  T at wall holding position for stretch 20 sec x 3  Shoulder flexion with red-> yellow TB back at wall x 10  Bilat shd flexion 2# DB x 10 cues to keep shoulder down and back   Manual Therapy: Soft tissue mobilization Rt upper quarter patient Lt sidelying and supine  Joint mobs Rt GH joint manually PROM Rt shoulder flexion, scaption within tissue tolerance - pt supine Joint mobs sitting and Lt sidelying  Modalities: Vaso medium pressure 34 deg x 10 min   [facility] Adult PT Treatment:                                                DATE: 10/31/22 Therapeutic Exercise: Seated Pulleys: flexion x 10 sec x 10 reps; scaption x 10 sec x 10 reps; shoulder horizontal abd x 1 min UBE L8 x 4 min alt fwd/back Doorway stretch 3 positions x 2 reps each position  T at wall holding position for stretch 20 sec x 3   Manual Therapy: Soft tissue mobilization Rt upper quarter patient Lt sidelying and supine  Joint mobs Rt GH joint manually PROM Rt shoulder flexion, scaption within tissue tolerance - pt supine Trigger Point Dry-Needling  Treatment instructions: Expect mild to moderate muscle soreness. S/S of pneumothorax if dry needled over a lung field, and to seek immediate medical attention should they occur. Patient verbalized understanding of these instructions and education.  Patient Consent Given: Yes Education handout provided: Previously provided Muscles treated: infraspinatus; teres; triceps tendon area; upper trap  Electrical stimulation performed: Yes Parameters:  mAmp current intensity adjusted to patient tolerance  Treatment response/outcome: decreased palpable tightness  Modalities: Moist heat x 10 min  Therapeutic Exercise: HEP Seated Pulleys: flexion x 10 sec x 10 reps; scaption x 10 sec x 10 reps; shoulder horizontal abd x 1 min Standing shoulder flexion wall slide x10 Standing  shoulder scaption wall slide x10 Standing "windshield wiper" wall slide x10 Supine shoulder flexion x10, with 1# x10 Sidelying shoulder scaption 2x10 AAROM with PT assist Sidelying open/close book 2x10 Serratus anterior red TB x10 Pendulums x10 CW & CCW Standing Row green TB 3 sec x 10 "W" reactive iso yellow TB 3 sec x10  ER green TB 3  sec x 10 IR green TB 3 sec x 10    PATIENT EDUCATION: Education details: POC; HEP  Person educated: Patient Education method: Explanation, Demonstration, Tactile cues, Verbal cues, and Handouts Education comprehension: verbalized understanding, returned demonstration, verbal cues required, tactile cues required, and needs further education  HOME EXERCISE PROGRAM: Access Code: [email or web address] URL: [email or web address] Date: 10/31/2022 Prepared by: Corlis Leak  Exercises - Circular Shoulder Pendulum with Table Support  - 3-4 x daily - 7 x weekly - 1 sets - 20-30 reps - Seated Cervical Retraction  - 3 x daily - 7 x weekly - 1 sets - 10 reps - Standing Scapular Retraction  - 3 x daily - 7 x weekly - 1 sets - 10 reps - 10 hold - Standing Infraspinatus/Teres Minor Release with Ball at Wall  - 2 x daily - 7 x weekly - Standing Pectoral Release with Shoulder Abduction with Ball at Wall  - 2-3 x daily - 7 x weekly - Seated Cervical Rotation AROM  - 2 x daily - 7 x weekly - 1 sets - 5 reps - 2-3 sec  hold - Seated Cervical Sidebending AROM  - 2 x daily - 7 x weekly - 1 sets - 5 reps - 5-10 sec  hold - Isometric Shoulder Abduction at Wall  - 2 x daily - 7 x weekly - 1 sets - 5-10 reps - 5 sec  hold - Isometric Shoulder External Rotation at Wall  - 2 x daily - 7 x weekly - 1 sets - 5 reps - 5 sec  hold - Isometric Shoulder Extension at Wall  - 2 x daily - 7 x weekly - 1 sets - 5-10 reps - 5 sec  hold - Standing Isometric Shoulder Internal Rotation with Towel Roll at Doorway  - 2 x daily - 7 x weekly - 1 sets - 5 reps - 5 sec  hold - Shoulder Scar  Massage  - 2 x daily - 7 x weekly - 1 sets - 1 reps - 5 min  hold - Standing Shoulder and Trunk Flexion at Table  - 2 x daily - 7 x weekly - 1 sets - 5 reps - 5-10 sec  hold - Supine Chest Stretch on Foam Roll  - 2 x daily - 7 x weekly - 1 sets - 1 reps - 2-5 min  sec  hold - Seated Shoulder Flexion AAROM with Pulley Behind  - 2 x daily - 7 x weekly - 1 sets - 10 reps - 10 sec  hold - Seated Shoulder Scaption AAROM with Pulley at Side  - 2 x daily - 7 x weekly - 1 sets - 10 reps - 10sec  hold - Standing Shoulder Row Reactive Isometric  - 2 x daily - 7 x weekly - 1 sets - 10 reps - 30-45 sec  hold - Shoulder External Rotation Reactive Isometrics  - 1 x daily - 7 x weekly - 1 sets - 10 reps - 3-5 sec  hold - Shoulder Internal Rotation Reactive Isometrics  - 2 x daily - 7 x weekly - 1 sets - 10 reps - 3-5 sec  hold - Seated Shoulder Flexion Slide at Table Top with Forearm in Neutral (Mirrored)  - 1 x daily - 7 x weekly - 1 sets - 10 reps - Seated Shoulder Scaption Slide at Table Top with Forearm in Neutral (Mirrored)  - 1 x daily - 7 x weekly - 1 sets - 10 reps -  Standing Bilateral Low Shoulder Row with Anchored Resistance  - 1 x daily - 7 x weekly - 1-3 sets - 10 reps - 2-3 sec  hold - Shoulder External Rotation with Anchored Resistance  - 1 x daily - 7 x weekly - 1-2 sets - 10 reps - 3 sec  hold - Shoulder Internal Rotation with Resistance  - 1 x daily - 7 x weekly - 2 sets - 10 reps - 3 sec  hold - Shoulder Extension with Resistance  - 1 x daily - 7 x weekly - 2 sets - 10 reps - 3 sec  hold - Standing Wall [employer] with Mini Swiss Ball  - 1 x daily - 7 x weekly - 1 sets - Standing Wall [employer] in Scaption with Mini Swiss Ball  - 1 x daily - 7 x weekly - 1 sets - Doorway Pec Stretch at 60 Degrees Abduction  - 3 x daily - 7 x weekly - 1 sets - 3 reps - Doorway Pec Stretch at 90 Degrees Abduction  - 3 x daily - 7 x weekly - 1 sets - 3 reps - 30 seconds  hold - Doorway Pec Stretch at 120  Degrees Abduction  - 3 x daily - 7 x weekly - 1 sets - 3 reps - 30 second hold  hold  ASSESSMENT:  CLINICAL IMPRESSION: Continued progress with decreased pain intensity and improved functional activity level. Strength continues to improve. Joint mobility continues to be tight but is gradually improving. Focus in PT is on manual work including DN, STM, joint mobs, PROM with patient in prone, Lt sidelying, supine. Note muscular tightness in the infraspinatus, teres, pecs; upper trap. Good release with treatment. Continued vaso following treatment to address soreness. Patient will benefit from continued treatment to address remaining problems and achieve maximum rehab potential.    OBJECTIVE IMPAIRMENTS: s/p Rt RCR 08/22/22 following a fall ~ 6 weeks before with injury to Rt shoulder. He had a previous Rt RCR ~ 8 years ago. Patient presents with Rt UE in abduction sling. He has limited functional ability with Rt UE; decreased ROM, strength, function Rt UE; continued edema Rt shoulder; poor posture and alignment; decreased scapular stability/strength.decreased activity tolerance, decreased mobility, decreased ROM, decreased strength, hypomobility, increased edema, increased fascial restrictions, impaired flexibility, impaired UE functional use, improper body mechanics, postural dysfunction, and pain.    GOALS: Goals reviewed with patient? Yes  SHORT TERM GOALS: Target date: 10/10/2022  Increase AROM Rt shoulder to 90 deg elevation Baseline: Goal status: IN PROGRESS  2.  Independent in initial HEP  Baseline:  Goal status: MET  3.  Review and progress with post op precautions, progressing as protocol dictates  Baseline:  Goal status: MET   LONG TERM GOALS: Target date: 01/09/2023  Improve posture and alignment with patient to demonstrate improved upright posture with posterior shoulder girdle engaged  Baseline:  Goal status: on going   2.  Increase AROM Rt shoulder to WFL's in all planes   Baseline:  Goal status:on going   3.  4/5 to 5/5 strength Rt shoulder  Baseline:  Goal status: partially accomplished   4.  Patient reports ability to return to functional activities using Rt UE including dressing, grooming, yard work, ADL's  Baseline:  Goal status: partially accomplished  5.  Independent HEP (including aquatic program as indicated) Baseline:  Goal status: on going   6.  Improve functional limitation score to 54 Baseline: 4 Goal status: on going  PLAN:  PT FREQUENCY: 1-2 x/week  PT DURATION: 6 weeks  PLANNED INTERVENTIONS: Therapeutic exercises, Therapeutic activity, Neuromuscular re-education, Patient/Family education, Self Care, Joint mobilization, Aquatic Therapy, Dry Needling, Electrical stimulation, Cryotherapy, Moist heat, Taping, Vasopneumatic device, Ultrasound, Ionotophoresis 4mg /ml Dexamethasone, Manual therapy, and Re-evaluation  PLAN FOR NEXT SESSION: review and progress exercises per protocol; manual work, DN, modalities as indicated; progressing exercises as indicated per protocol    Val Riles, PT 11/21/2022, 8:43 AM

## 2022-11-23 ENCOUNTER — Encounter: Payer: Self-pay | Admitting: Rehabilitative and Restorative Service Providers"

## 2022-11-28 ENCOUNTER — Encounter: Payer: Self-pay | Admitting: Rehabilitative and Restorative Service Providers"

## 2022-11-28 ENCOUNTER — Ambulatory Visit: Payer: Medicare Other | Attending: Orthopaedic Surgery | Admitting: Rehabilitative and Restorative Service Providers"

## 2022-11-28 DIAGNOSIS — R293 Abnormal posture: Secondary | ICD-10-CM | POA: Diagnosis present

## 2022-11-28 DIAGNOSIS — M25511 Pain in right shoulder: Secondary | ICD-10-CM | POA: Diagnosis present

## 2022-11-28 DIAGNOSIS — M25611 Stiffness of right shoulder, not elsewhere classified: Secondary | ICD-10-CM | POA: Insufficient documentation

## 2022-11-28 DIAGNOSIS — R29898 Other symptoms and signs involving the musculoskeletal system: Secondary | ICD-10-CM | POA: Insufficient documentation

## 2022-11-28 DIAGNOSIS — M6281 Muscle weakness (generalized): Secondary | ICD-10-CM | POA: Insufficient documentation

## 2022-11-28 NOTE — Therapy (Signed)
Patient Name: Johnny Arnold MRN: [id number] DOB:06-21-1955, 67 y.o., male Today's Date: 11/28/2022  END OF SESSION:  PT End of Session - 11/28/22 0801     Visit Number 22    Number of Visits 30    Date for PT Re-Evaluation 01/09/23    Authorization Type [employer]    Progress Note Due on Visit 30    PT Start Time 0758    PT Stop Time 0845    PT Time Calculation (min) 47 min    Activity Tolerance Patient tolerated treatment well             History reviewed. No pertinent past medical history. Past Surgical History:  Procedure Laterality Date   left total hip arthroplasty Left 06/03/2019   There are no problems to display for this patient.   PCP: Dr Lasandra Beech  REFERRING PROVIDER: Shon Baton, PA-C; Dr Theophilus Bones  REFERRING DIAG: Rt RCR   THERAPY DIAG:  Acute pain of right shoulder  Stiffness of right shoulder, not elsewhere classified  Muscle weakness (generalized)  Other symptoms and signs involving the musculoskeletal system  Abnormal posture  Rationale for Evaluation and Treatment: Rehabilitation  ONSET DATE: 08/22/22  SUBJECTIVE:                                                                                                                                                                                      SUBJECTIVE STATEMENT: Pt reports that pain in the Rt shoulder persists but is not constant. He does not have pain when he is not using the arm. Shoulder pain will awaken him at night when he gets on the R shoulder. Most of time pain is now 1-2/10 but can go up to 5/10 when he reaches - that will be a sharp pain. His ROM is good and the strength is improving. He can reach overhead now. He is using R UE for most functional activities but is avoiding lifting anything heavy. Has been working in the yard, putting and chipping. He was on vacation last couple of weeks.    EVAL: Patient reports that he fell off a ladder ~ 7 weeks ago with injury to  Rt shoulder. Underwent Rt RCR arthroscopically 08/22/22 with some continued bleeding following surgery. No longer bleeding (stopped Saturday 08/26/22). In abduction sling except for dressing.  Hand dominance: Right  PERTINENT HISTORY: Rt RCR ~ 8 years ago; Lt THA 5 yrs ago; lumbar surgery 15 yrs ago; history of chronic LBP; asthma; arthritis; sleep apnea  PAIN:  Are you having pain? Yes: NPRS scale: 1-2/10 Pain location: shoulder and biceps Pain description: dull aching  Aggravating factors: moving  and getting in and out of the sling  Relieving factors: meds; ice  PRECAUTIONS: Shoulder- post op protocol  WEIGHT BEARING RESTRICTIONS: Yes no wt bearing Rt UE   FALLS:  Has patient fallen in last 6 months? Yes. 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   UPPER EXTREMITY ROM:   Active/Passive ROM Right Eval Passive  Sitting  Right  09/21/22 Passive  Supine  Right  10/17/22 Active  Standing  Right  10/24/22 Active  Standing  Left Eval AROM Standing  Right  11/21/22 AROM Standing   Shoulder flexion 75 deg 112 115 115 136 139  Shoulder extension   45 52 45 46  Shoulder abduction   63 72 140 88  Shoulder adduction        Shoulder internal rotation   Hand to lower sacrum  Hand to sacrum  Thumb &9 pain Hand to sacrum  Shoulder external rotation   50 Shoulder in neutral at side  64  Shoulder in neutral at side  90 shoulder at 90 deg, elbow 90 deg  75 shoulder at 90 deg, elbow 90  deg   Elbow flexion        Elbow extension        Wrist flexion        Wrist extension        Wrist ulnar deviation        Wrist radial deviation        Wrist pronation        Wrist supination        (Blank rows = not tested) Cervical ROM - tight end ranges greatest with Lt lateral flexion and Lt rotation   UPPER EXTREMITY MMT: 11/21/22  MMT Right eval Left eval  Shoulder flexion 4 5  Shoulder extension 5 5  Shoulder abduction 4- 5  Shoulder adduction    Shoulder internal rotation 4+ 5  Shoulder external rotation 4+ 5  Middle trapezius    Lower trapezius    Elbow flexion    Elbow extension    Wrist flexion    Wrist extension    Wrist ulnar deviation    Wrist radial deviation    Wrist pronation    Wrist supination    Grip strength (lbs)    (Blank rows = not tested)  PALPATION: muscular tightness Rt shoulder girdle   [facility] Adult PT Treatment:                                                DATE: 11/28/22 Therapeutic Exercise: Pulley flexion 10 sec x 10 Pulley scaption 10 sec x 10  Pulley IR 10 sec x 10  UBE L8 x 4 min alt fwd/back Doorway stretch 30 sec 3 positions x 2 reps each position  Overdoor stretch for shoulder flexion 20 sec x 2  T at wall turning slightly  to L 20 sec x 3   Serratus sliding hands up wall red TB x 5  Wall clock red TB x 5 R/L Shoulder flexion with red-> yellow TB back at wall x 10  Bilat shd flexion 2# DB x 10 cues to keep shoulder down and back  ER with UE propped at 80 deg abd for ER yellow TB x 10  Large circles fwd/back  Manual Therapy: Soft tissue mobilization Rt upper quarter patient Lt sidelying and supine  Joint mobs Rt GH joint manually PROM Rt shoulder flexion, scaption within tissue tolerance - pt supine Trigger Point Dry-Needling  Treatment instructions: Expect mild to moderate muscle soreness. S/S of pneumothorax if dry needled over a lung field, and to seek immediate medical attention should they occur. Patient verbalized  understanding of these instructions and education.  Patient Consent Given: Yes Education handout provided: Previously provided Muscles treated: biceps; lat tendon   Electrical stimulation performed: Yes Parameters: mAmp current intensity adjusted to patient tolerance  Treatment response/outcome: decreased palpable tightness  Soft tissue mobilization Rt upper quarter patient Lt sidelying and supine  Joint mobs Rt GH joint manually PROM Rt shoulder flexion, scaption within tissue tolerance - pt supine Joint mobs sitting and Lt sidelying  Modalities: Vaso medium pressure 34 deg x 10 min   [facility] Adult PT Treatment:                                                DATE: 11/21/22 Therapeutic Exercise: UBE L8 x 4 min alt fwd/back Doorway stretch 3 positions x 2 reps each position  Overdoor stretch for shoulder flexion 20 sec x 2  Shoulder flexion stepping under therapy ball  T at wall holding position for stretch 20 sec x 3  Shoulder flexion with red-> yellow TB back at wall x 10  Bilat shd flexion 2# DB x 10 cues to keep shoulder down and back  ER with UE propped at 80 deg abd for ER yellow TB x 10  Large circles fwd/back   Manual Therapy: Soft tissue mobilization Rt upper quarter patient Lt sidelying and supine  Joint mobs Rt GH joint manually PROM Rt shoulder flexion, scaption within tissue tolerance - pt supine Joint mobs sitting and Lt sidelying  Modalities: Vaso medium pressure 34 deg x 10 min    Therapeutic Exercise: HEP Seated Pulleys: flexion x 10 sec x 10 reps; scaption x 10 sec x 10 reps; shoulder horizontal abd x 1 min Standing shoulder flexion wall slide x10 Standing shoulder scaption wall slide x10 Standing "windshield wiper" wall slide x10 Supine shoulder flexion x10, with 1# x10 Sidelying shoulder scaption 2x10 AAROM with PT assist Sidelying open/close book 2x10 Serratus anterior red TB x10 Pendulums x10 CW & CCW Standing Row green TB 3 sec x 10 "W" reactive iso  yellow TB 3 sec x10  ER green TB 3 sec x 10 IR green TB 3 sec x 10    PATIENT EDUCATION: Education details: POC; HEP  Person educated: Patient Education method: Programmer, multimedia, Demonstration, Tactile cues, Verbal cues, and Handouts Education comprehension: verbalized understanding, returned demonstration, verbal cues required, tactile cues required, and needs further education  HOME EXERCISE PROGRAM: Access Code: [email or web address] URL: [email or web address] Date: 11/28/2022 Prepared by: Corlis Leak  Exercises - Circular Shoulder Pendulum with Table Support  - 3-4 x daily - 7 x weekly -  1 sets - 20-30 reps - Seated Cervical Retraction  - 3 x daily - 7 x weekly - 1 sets - 10 reps - Standing Scapular Retraction  - 3 x daily - 7 x weekly - 1 sets - 10 reps - 10 hold - Standing Infraspinatus/Teres Minor Release with Ball at Wall  - 2 x daily - 7 x weekly - Standing Pectoral Release with Shoulder Abduction with Ball at Wall  - 2-3 x daily - 7 x weekly - Seated Cervical Rotation AROM  - 2 x daily - 7 x weekly - 1 sets - 5 reps - 2-3 sec  hold - Seated Cervical Sidebending AROM  - 2 x daily - 7 x weekly - 1 sets - 5 reps - 5-10 sec  hold - Isometric Shoulder Abduction at Wall  - 2 x daily - 7 x weekly - 1 sets - 5-10 reps - 5 sec  hold - Isometric Shoulder External Rotation at Wall  - 2 x daily - 7 x weekly - 1 sets - 5 reps - 5 sec  hold - Isometric Shoulder Extension at Wall  - 2 x daily - 7 x weekly - 1 sets - 5-10 reps - 5 sec  hold - Standing Isometric Shoulder Internal Rotation with Towel Roll at Doorway  - 2 x daily - 7 x weekly - 1 sets - 5 reps - 5 sec  hold - Shoulder Scar Massage  - 2 x daily - 7 x weekly - 1 sets - 1 reps - 5 min  hold - Standing Shoulder and Trunk Flexion at Table  - 2 x daily - 7 x weekly - 1 sets - 5 reps - 5-10 sec  hold - Supine Chest Stretch on Foam Roll  - 2 x daily - 7 x weekly - 1 sets - 1 reps - 2-5 min  sec  hold - Seated Shoulder Flexion AAROM with  Pulley Behind  - 2 x daily - 7 x weekly - 1 sets - 10 reps - 10 sec  hold - Seated Shoulder Scaption AAROM with Pulley at Side  - 2 x daily - 7 x weekly - 1 sets - 10 reps - 10sec  hold - Standing Shoulder Row Reactive Isometric  - 2 x daily - 7 x weekly - 1 sets - 10 reps - 30-45 sec  hold - Shoulder External Rotation Reactive Isometrics  - 1 x daily - 7 x weekly - 1 sets - 10 reps - 3-5 sec  hold - Shoulder Internal Rotation Reactive Isometrics  - 2 x daily - 7 x weekly - 1 sets - 10 reps - 3-5 sec  hold - Seated Shoulder Flexion Slide at Table Top with Forearm in Neutral (Mirrored)  - 1 x daily - 7 x weekly - 1 sets - 10 reps - Seated Shoulder Scaption Slide at Table Top with Forearm in Neutral (Mirrored)  - 1 x daily - 7 x weekly - 1 sets - 10 reps - Standing Bilateral Low Shoulder Row with Anchored Resistance  - 1 x daily - 7 x weekly - 1-3 sets - 10 reps - 2-3 sec  hold - Shoulder External Rotation with Anchored Resistance  - 1 x daily - 7 x weekly - 1-2 sets - 10 reps - 3 sec  hold - Shoulder Internal Rotation with Resistance  - 1 x daily - 7 x weekly - 2 sets - 10 reps - 3 sec  hold - Shoulder  Extension with Resistance  - 1 x daily - 7 x weekly - 2 sets - 10 reps - 3 sec  hold - Standing Wall [employer] with Mini Swiss Ball  - 1 x daily - 7 x weekly - 1 sets - Standing Wall [employer] in Scaption with Mini Swiss Ball  - 1 x daily - 7 x weekly - 1 sets - Doorway Pec Stretch at 60 Degrees Abduction  - 3 x daily - 7 x weekly - 1 sets - 3 reps - Doorway Pec Stretch at 90 Degrees Abduction  - 3 x daily - 7 x weekly - 1 sets - 3 reps - 30 seconds  hold - Doorway Pec Stretch at 120 Degrees Abduction  - 3 x daily - 7 x weekly - 1 sets - 3 reps - 30 second hold  hold - Shoulder Flexion Serratus Activation with Resistance  - 1 x daily - 7 x weekly - 1 sets - 10 reps - 3-5 sec  hold - Wall Clock with Theraband  - 1 x daily - 7 x weekly - 1 sets - 10 reps - 2-3 sec  hold  ASSESSMENT:  CLINICAL  IMPRESSION: Continued progress with decreased pain frequency and intensity. Pain is no longer constant. Diago is using R UE for more functional activities.  Strength continues to improve. Joint mobility continues to be tight but is gradually improving. Focus in PT is on manual work including DN, STM, joint mobs, PROM with patient in prone, Lt sidelying, supine. Note muscular tightness in the infraspinatus, teres, pecs; upper trap. Good release with treatment. Continued vaso following treatment to address soreness.    OBJECTIVE IMPAIRMENTS: s/p Rt RCR 08/22/22 following a fall ~ 6 weeks before with injury to Rt shoulder. He had a previous Rt RCR ~ 8 years ago. Patient presents with Rt UE in abduction sling. He has limited functional ability with Rt UE; decreased ROM, strength, function Rt UE; continued edema Rt shoulder; poor posture and alignment; decreased scapular stability/strength.decreased activity tolerance, decreased mobility, decreased ROM, decreased strength, hypomobility, increased edema, increased fascial restrictions, impaired flexibility, impaired UE functional use, improper body mechanics, postural dysfunction, and pain.    GOALS: Goals reviewed with patient? Yes  SHORT TERM GOALS: Target date: 10/10/2022  Increase AROM Rt shoulder to 90 deg elevation Baseline: Goal status: IN PROGRESS  2.  Independent in initial HEP  Baseline:  Goal status: MET  3.  Review and progress with post op precautions, progressing as protocol dictates  Baseline:  Goal status: MET   LONG TERM GOALS: Target date: 01/09/2023  Improve posture and alignment with patient to demonstrate improved upright posture with posterior shoulder girdle engaged  Baseline:  Goal status: on going   2.  Increase AROM Rt shoulder to WFL's in all planes  Baseline:  Goal status:on going   3.  4/5 to 5/5 strength Rt shoulder  Baseline:  Goal status: partially accomplished   4.  Patient reports ability to return to  functional activities using Rt UE including dressing, grooming, yard work, ADL's  Baseline:  Goal status: partially accomplished  5.  Independent HEP (including aquatic program as indicated) Baseline:  Goal status: on going   6.  Improve functional limitation score to 54 Baseline: 4 Goal status: on going   PLAN:  PT FREQUENCY: 1-2 x/week  PT DURATION: 6 weeks  PLANNED INTERVENTIONS: Therapeutic exercises, Therapeutic activity, Neuromuscular re-education, Patient/Family education, Self Care, Joint mobilization, Aquatic Therapy, Dry Needling, Electrical stimulation,  Cryotherapy, Moist heat, Taping, Vasopneumatic device, Ultrasound, Ionotophoresis 4mg /ml Dexamethasone, Manual therapy, and Re-evaluation  PLAN FOR NEXT SESSION: review and progress exercises per protocol; manual work, DN, modalities as indicated; progressing exercises as indicated per protocol    Naydeline Morace Rober Minion, PT 11/28/2022, 8:02 AM

## 2022-11-29 ENCOUNTER — Encounter: Payer: Self-pay | Admitting: Physical Therapy

## 2022-12-05 ENCOUNTER — Encounter: Payer: Self-pay | Admitting: Rehabilitative and Restorative Service Providers"

## 2022-12-05 ENCOUNTER — Ambulatory Visit: Payer: Medicare Other | Admitting: Rehabilitative and Restorative Service Providers"

## 2022-12-05 DIAGNOSIS — M25511 Pain in right shoulder: Secondary | ICD-10-CM | POA: Diagnosis not present

## 2022-12-05 DIAGNOSIS — R293 Abnormal posture: Secondary | ICD-10-CM

## 2022-12-05 DIAGNOSIS — M6281 Muscle weakness (generalized): Secondary | ICD-10-CM

## 2022-12-05 DIAGNOSIS — M25611 Stiffness of right shoulder, not elsewhere classified: Secondary | ICD-10-CM

## 2022-12-05 DIAGNOSIS — R29898 Other symptoms and signs involving the musculoskeletal system: Secondary | ICD-10-CM

## 2022-12-05 NOTE — Therapy (Addendum)
PROGRESS NOTE AND DISCHARGE SUMMARY     PHYSICAL THERAPY DISCHARGE SUMMARY  Visits from Start of Care: 23  Current functional level related to goals / functional outcomes: SEE PROGRESS NOTE    Remaining deficits: UNKNOWN    Education / Equipment: HEP   Patient agrees to discharge. Patient goals were met. Patient is being discharged due to meeting the stated rehab goals.  Fernando Torry P. Leonor Liv PT, MPH 04/19/23 8:24 AM   Patient Name: Johnny Arnold MRN: [id number] DOB:10-Sep-1955, 67 y.o., male Today's Date: 12/05/2022  END OF SESSION:  PT End of Session - 12/05/22 0933     Visit Number 23    Number of Visits 30    Date for PT Re-Evaluation 01/09/23    Authorization Type [employer]    Progress Note Due on Visit 30    PT Start Time 0930    PT Stop Time 1018    PT Time Calculation (min) 48 min    Activity Tolerance Patient tolerated treatment well             History reviewed. No pertinent past medical history. Past Surgical History:  Procedure Laterality Date   left total hip arthroplasty Left 06/03/2019   There are no problems to display for this patient.   PCP: Dr Lasandra Beech  REFERRING PROVIDER: Shon Baton, PA-C; Dr Theophilus Bones  REFERRING DIAG: Rt RCR   THERAPY DIAG:  Acute pain of right shoulder  Stiffness of right shoulder, not elsewhere classified  Muscle weakness (generalized)  Other symptoms and signs involving the musculoskeletal system  Abnormal posture  Rationale for Evaluation and Treatment: Rehabilitation  ONSET DATE: 08/22/22  SUBJECTIVE:                                                                                                                                                                                      SUBJECTIVE STATEMENT: Pt reports that pain and soreness in the Rt shoulder persists but it is less intense and not constant. He does not have pain when he is not using the arm. Shoulder pain will awaken him at night  when he gets on the R shoulder. Most of time pain is now 1-2/10 but can go up to 5/10 when he reaches - that will be a sharp pain. His ROM is good and the strength is improving. He can reach overhead now. He is using R UE for most functional activities but is avoiding lifting anything heavy. Has been working in the yard, putting and chipping. He was on vacation last couple of weeks.    EVAL: Patient reports that he fell off a ladder ~ 7 weeks ago with  injury to Rt shoulder. Underwent Rt RCR arthroscopically 08/22/22 with some continued bleeding following surgery. No longer bleeding (stopped Saturday 08/26/22). In abduction sling except for dressing.  Hand dominance: Right  PERTINENT HISTORY: Rt RCR ~ 8 years ago; Lt THA 5 yrs ago; lumbar surgery 15 yrs ago; history of chronic LBP; asthma; arthritis; sleep apnea  PAIN:  Are you having pain? Yes: NPRS scale: 1-2/10 Pain location: shoulder and biceps Pain description: dull aching  Aggravating factors: moving and getting in and out of the sling  Relieving factors: meds; ice  PRECAUTIONS: Shoulder- post op protocol  WEIGHT BEARING RESTRICTIONS: Yes no wt bearing Rt UE   FALLS:  Has patient fallen in last 6 months? Yes. 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   UPPER EXTREMITY ROM:   Active/Passive ROM Right Eval Passive  Sitting  Right  09/21/22 Passive  Supine  Right  10/17/22 Active  Standing  Right  10/24/22 Active  Standing   Left Eval AROM Standing  Right  11/21/22 AROM Standing  Right  12/05/22 AROM Standing   Shoulder flexion 75 deg 112 115 115 136 139 140  Shoulder extension   45 52 45 46 54  Shoulder abduction   63 72 140 88 110  Shoulder adduction         Shoulder internal rotation   Hand to lower sacrum  Hand to sacrum  Thumb &9 pain Hand to sacrum Hand to waist  Shoulder external rotation   50 Shoulder in neutral at side  64  Shoulder in neutral at side  90 shoulder at 90 deg, elbow 90 deg  75 shoulder at 90 deg, elbow 90 deg  82 shoulder at 90 deg, elbow 90 deg  Elbow flexion         Elbow extension         Wrist flexion         Wrist extension         Wrist ulnar deviation         Wrist radial deviation         Wrist pronation         Wrist supination         (Blank rows = not tested) Cervical ROM - tight end ranges greatest with Lt lateral flexion and Lt rotation   UPPER EXTREMITY MMT: 11/21/22  MMT Right  Right  12/05/22 Left   Shoulder flexion 4 4 5   Shoulder extension 5 5 5   Shoulder abduction 4- 4- 5  Shoulder adduction     Shoulder internal rotation 4+ 4+ 5  Shoulder external rotation 4+ 4+ 5  Middle trapezius     Lower trapezius     Elbow flexion     Elbow extension     Wrist flexion     Wrist extension     Wrist ulnar deviation     Wrist radial deviation     Wrist pronation     Wrist supination  Grip strength (lbs)     (Blank rows = not tested)  PALPATION: muscular tightness Rt shoulder girdle   [facility] Adult PT Treatment:                                                DATE: 12/05/22 Therapeutic Exercise: UBE L8 x 4 min alt fwd/back Doorway stretch 3 positions x 2 reps each position  Overdoor stretch for shoulder flexion 20 sec x 2  IR with pulley 10 sec x 5   T at wall holding position for stretch 20 sec x 3  Shoulder flexion with red-> yellow TB back at wall x 10  Bilat shd flexion 2# DB x 10 cues to keep shoulder down and back  ER with UE propped at 80 deg abd  for ER yellow TB x 10  Large circles fwd/back   Manual Therapy: Soft tissue mobilization Rt upper quarter patient Lt sidelying and supine  Joint mobs Rt GH joint manually PROM Rt shoulder flexion, scaption within tissue tolerance - pt supine and sidelying  Joint mobs sitting and Lt sidelying  Modalities: Vaso medium pressure 34 deg x 10 min   [facility] Adult PT Treatment:                                                DATE: 11/28/22 Therapeutic Exercise: Pulley flexion 10 sec x 10 Pulley scaption 10 sec x 10  Pulley IR 10 sec x 10  UBE L8 x 4 min alt fwd/back Doorway stretch 30 sec 3 positions x 2 reps each position  Overdoor stretch for shoulder flexion 20 sec x 2  T at wall turning slightly to L 20 sec x 3   Serratus sliding hands up wall red TB x 5  Wall clock red TB x 5 R/L Shoulder flexion with red-> yellow TB back at wall x 10  Bilat shd flexion 2# DB x 10 cues to keep shoulder down and back  ER with UE propped at 80 deg abd for ER yellow TB x 10  Large circles fwd/back  Manual Therapy: Soft tissue mobilization Rt upper quarter patient Lt sidelying and supine  Joint mobs Rt GH joint manually PROM Rt shoulder flexion, scaption within tissue tolerance - pt supine Trigger Point Dry-Needling  Treatment instructions: Expect mild to moderate muscle soreness. S/S of pneumothorax if dry needled over a lung field, and to seek immediate medical attention should they occur. Patient verbalized understanding of these instructions and education.  Patient Consent Given: Yes Education handout provided: Previously provided Muscles treated: biceps; lat tendon   Electrical stimulation performed: Yes Parameters: mAmp current intensity adjusted to patient tolerance  Treatment response/outcome: decreased palpable tightness  Soft tissue mobilization Rt upper quarter patient Lt sidelying and supine  Joint mobs Rt GH joint manually PROM Rt shoulder flexion, scaption within tissue tolerance - pt  supine Joint mobs sitting and Lt sidelying  Modalities: Vaso medium pressure 34 deg x 10 min    Therapeutic Exercise: HEP Seated Pulleys: flexion x 10 sec x 10 reps; scaption x 10 sec x 10 reps; shoulder horizontal abd x 1 min Standing shoulder flexion wall slide x10 Standing shoulder scaption wall slide x10 Standing "windshield wiper" wall  slide x10 Supine shoulder flexion x10, with 1# x10 Sidelying shoulder scaption 2x10 AAROM with PT assist Sidelying open/close book 2x10 Serratus anterior red TB x10 Pendulums x10 CW & CCW Standing Row green TB 3 sec x 10 "W" reactive iso yellow TB 3 sec x10  ER green TB 3 sec x 10 IR green TB 3 sec x 10    PATIENT EDUCATION: Education details: POC; HEP  Person educated: Patient Education method: Programmer, multimedia, Demonstration, Tactile cues, Verbal cues, and Handouts Education comprehension: verbalized understanding, returned demonstration, verbal cues required, tactile cues required, and needs further education  HOME EXERCISE PROGRAM: Access Code: [email or web address] URL: [email or web address] Date: 11/28/2022 Prepared by: Corlis Leak  Exercises - Circular Shoulder Pendulum with Table Support  - 3-4 x daily - 7 x weekly - 1 sets - 20-30 reps - Seated Cervical Retraction  - 3 x daily - 7 x weekly - 1 sets - 10 reps - Standing Scapular Retraction  - 3 x daily - 7 x weekly - 1 sets - 10 reps - 10 hold - Standing Infraspinatus/Teres Minor Release with Ball at Wall  - 2 x daily - 7 x weekly - Standing Pectoral Release with Shoulder Abduction with Ball at Wall  - 2-3 x daily - 7 x weekly - Seated Cervical Rotation AROM  - 2 x daily - 7 x weekly - 1 sets - 5 reps - 2-3 sec  hold - Seated Cervical Sidebending AROM  - 2 x daily - 7 x weekly - 1 sets - 5 reps - 5-10 sec  hold - Isometric Shoulder Abduction at Wall  - 2 x daily - 7 x weekly - 1 sets - 5-10 reps - 5 sec  hold - Isometric Shoulder External Rotation at Wall  - 2 x daily - 7 x weekly - 1  sets - 5 reps - 5 sec  hold - Isometric Shoulder Extension at Wall  - 2 x daily - 7 x weekly - 1 sets - 5-10 reps - 5 sec  hold - Standing Isometric Shoulder Internal Rotation with Towel Roll at Doorway  - 2 x daily - 7 x weekly - 1 sets - 5 reps - 5 sec  hold - Shoulder Scar Massage  - 2 x daily - 7 x weekly - 1 sets - 1 reps - 5 min  hold - Standing Shoulder and Trunk Flexion at Table  - 2 x daily - 7 x weekly - 1 sets - 5 reps - 5-10 sec  hold - Supine Chest Stretch on Foam Roll  - 2 x daily - 7 x weekly - 1 sets - 1 reps - 2-5 min  sec  hold - Seated Shoulder Flexion AAROM with Pulley Behind  - 2 x daily - 7 x weekly - 1 sets - 10 reps - 10 sec  hold - Seated Shoulder Scaption AAROM with Pulley at Side  - 2 x daily - 7 x weekly - 1 sets - 10 reps - 10sec  hold - Standing Shoulder Row Reactive Isometric  - 2 x daily - 7 x weekly - 1 sets - 10 reps - 30-45 sec  hold - Shoulder External Rotation Reactive Isometrics  - 1 x daily - 7 x weekly - 1 sets - 10 reps - 3-5 sec  hold - Shoulder Internal Rotation Reactive Isometrics  - 2 x daily - 7 x weekly - 1 sets - 10 reps - 3-5 sec  hold - Seated Shoulder Flexion Slide  at Table Top with Forearm in Neutral (Mirrored)  - 1 x daily - 7 x weekly - 1 sets - 10 reps - Seated Shoulder Scaption Slide at Table Top with Forearm in Neutral (Mirrored)  - 1 x daily - 7 x weekly - 1 sets - 10 reps - Standing Bilateral Low Shoulder Row with Anchored Resistance  - 1 x daily - 7 x weekly - 1-3 sets - 10 reps - 2-3 sec  hold - Shoulder External Rotation with Anchored Resistance  - 1 x daily - 7 x weekly - 1-2 sets - 10 reps - 3 sec  hold - Shoulder Internal Rotation with Resistance  - 1 x daily - 7 x weekly - 2 sets - 10 reps - 3 sec  hold - Shoulder Extension with Resistance  - 1 x daily - 7 x weekly - 2 sets - 10 reps - 3 sec  hold - Standing Wall [employer] with Mini Swiss Ball  - 1 x daily - 7 x weekly - 1 sets - Standing Wall [employer] in Scaption with Mini  Swiss Ball  - 1 x daily - 7 x weekly - 1 sets - Doorway Pec Stretch at 60 Degrees Abduction  - 3 x daily - 7 x weekly - 1 sets - 3 reps - Doorway Pec Stretch at 90 Degrees Abduction  - 3 x daily - 7 x weekly - 1 sets - 3 reps - 30 seconds  hold - Doorway Pec Stretch at 120 Degrees Abduction  - 3 x daily - 7 x weekly - 1 sets - 3 reps - 30 second hold  hold - Shoulder Flexion Serratus Activation with Resistance  - 1 x daily - 7 x weekly - 1 sets - 10 reps - 3-5 sec  hold - Wall Clock with Theraband  - 1 x daily - 7 x weekly - 1 sets - 10 reps - 2-3 sec  hold  ASSESSMENT:  CLINICAL IMPRESSION: Continued progress with decreased pain frequency and intensity. Pain is no longer constant. Sanjeev is using R UE for more functional activities.  Strength continues to improve. Joint mobility continues to be tight but is gradually improving. Focus in PT is on manual work including DN, STM, joint mobs, PROM with patient in prone, Lt sidelying, supine. Note muscular tightness in the infraspinatus, teres, pecs; upper trap. Good release with treatment. Continued vaso following treatment to address soreness and edema. Chrisopher is progressing well with good gains in ROM and strength. Linville continues to have end range tightness in IR and horizontal adduction and weakness in elevation in flexion and abduction. He will benefit from continued treatment 1x/week to address deficits.    OBJECTIVE IMPAIRMENTS: s/p Rt RCR 08/22/22 following a fall ~ 6 weeks before with injury to Rt shoulder. He had a previous Rt RCR ~ 8 years ago. Patient presents with Rt UE in abduction sling. He has limited functional ability with Rt UE; decreased ROM, strength, function Rt UE; continued edema Rt shoulder; poor posture and alignment; decreased scapular stability/strength.decreased activity tolerance, decreased mobility, decreased ROM, decreased strength, hypomobility, increased edema, increased fascial restrictions, impaired flexibility,  impaired UE functional use, improper body mechanics, postural dysfunction, and pain.    GOALS: Goals reviewed with patient? Yes  SHORT TERM GOALS: Target date: 10/10/2022  Increase AROM Rt shoulder to 90 deg elevation Baseline: Goal status: IN PROGRESS  2.  Independent in initial HEP  Baseline:  Goal status: MET  3.  Review  and progress with post op precautions, progressing as protocol dictates  Baseline:  Goal status: MET   LONG TERM GOALS: Target date: 01/09/2023  Improve posture and alignment with patient to demonstrate improved upright posture with posterior shoulder girdle engaged  Baseline:  Goal status: on going   2.  Increase AROM Rt shoulder to WFL's in all planes  Baseline:  Goal status:on going   3.  4/5 to 5/5 strength Rt shoulder  Baseline:  Goal status: partially accomplished   4.  Patient reports ability to return to functional activities using Rt UE including dressing, grooming, yard work, ADL's  Baseline:  Goal status: partially accomplished  5.  Independent HEP (including aquatic program as indicated) Baseline:  Goal status: on going   6.  Improve functional limitation score to 54 Baseline: 4 Goal status: on going   PLAN:  PT FREQUENCY: 1-2 x/week  PT DURATION: 6 weeks  PLANNED INTERVENTIONS: Therapeutic exercises, Therapeutic activity, Neuromuscular re-education, Patient/Family education, Self Care, Joint mobilization, Aquatic Therapy, Dry Needling, Electrical stimulation, Cryotherapy, Moist heat, Taping, Vasopneumatic device, Ultrasound, Ionotophoresis 4mg /ml Dexamethasone, Manual therapy, and Re-evaluation  PLAN FOR NEXT SESSION: review and progress exercises per protocol; manual work, DN, modalities as indicated; progressing exercises as indicated per protocol    Val Riles, PT 12/05/2022, 9:33 AM

## 2023-04-24 IMAGING — DX DG ABDOMEN 2V
3 series · 3 of 3 positions shown · non-contrast
Comparison: None.

CLINICAL DATA: Nausea, diarrhea, abdominal distention for 1 month,
suspect constipation

EXAM:
ABDOMEN - 2 VIEW

[abdomen erect]
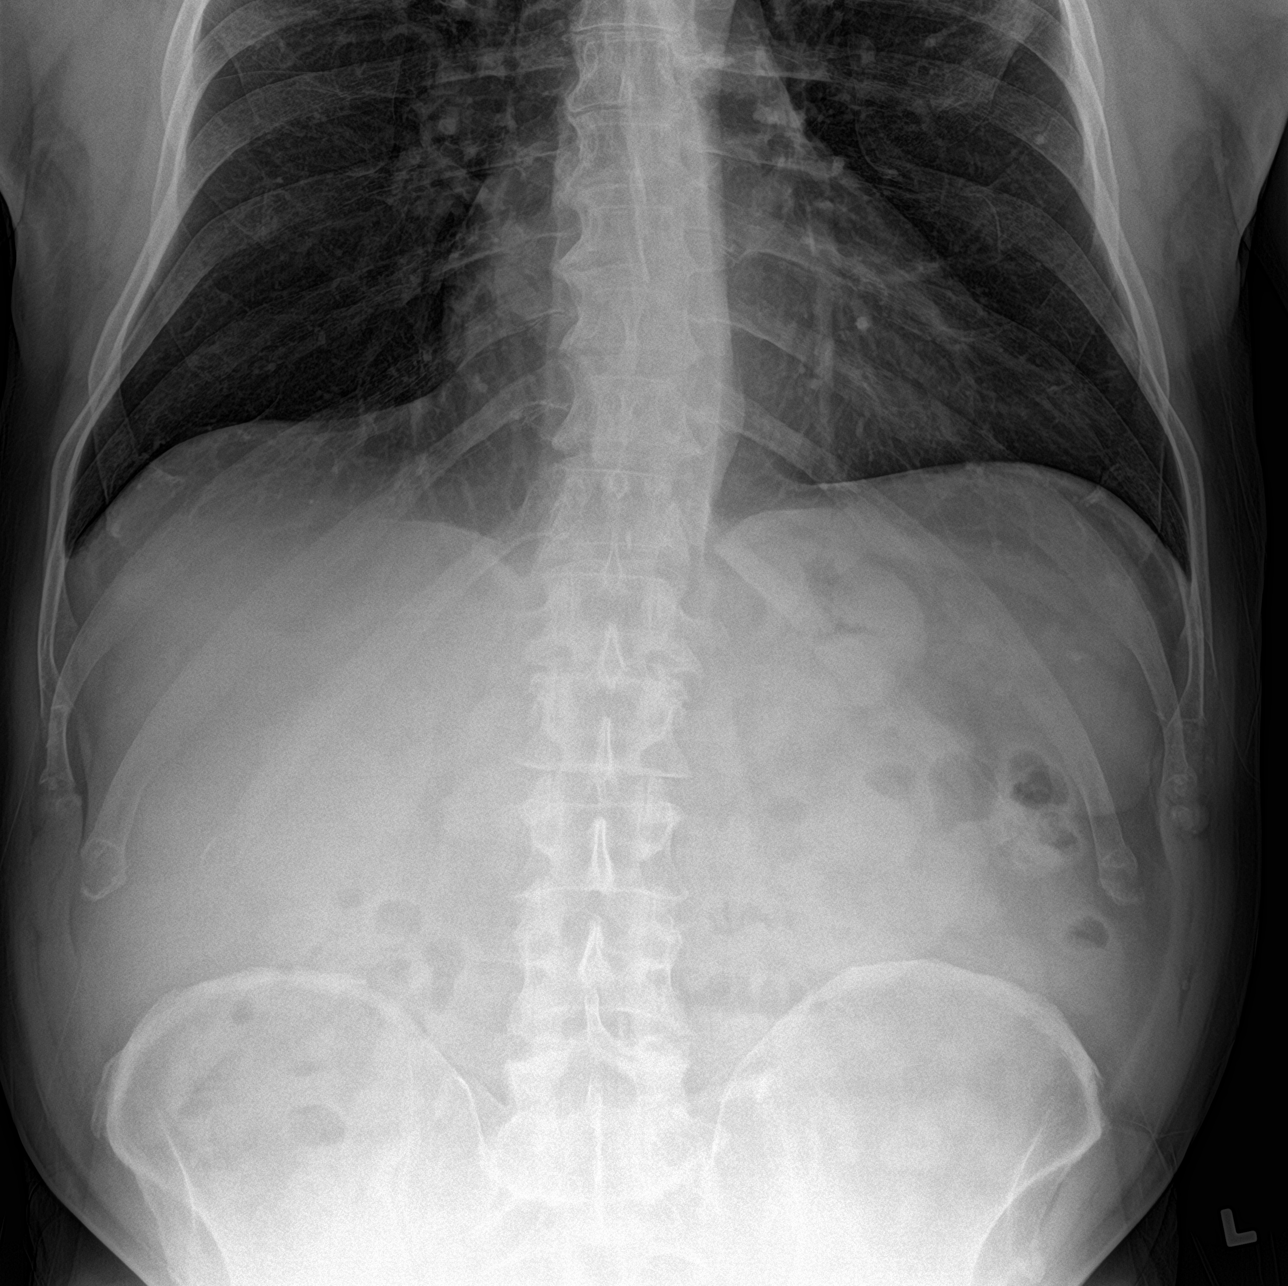

[abdomen supine (1 of 2)]
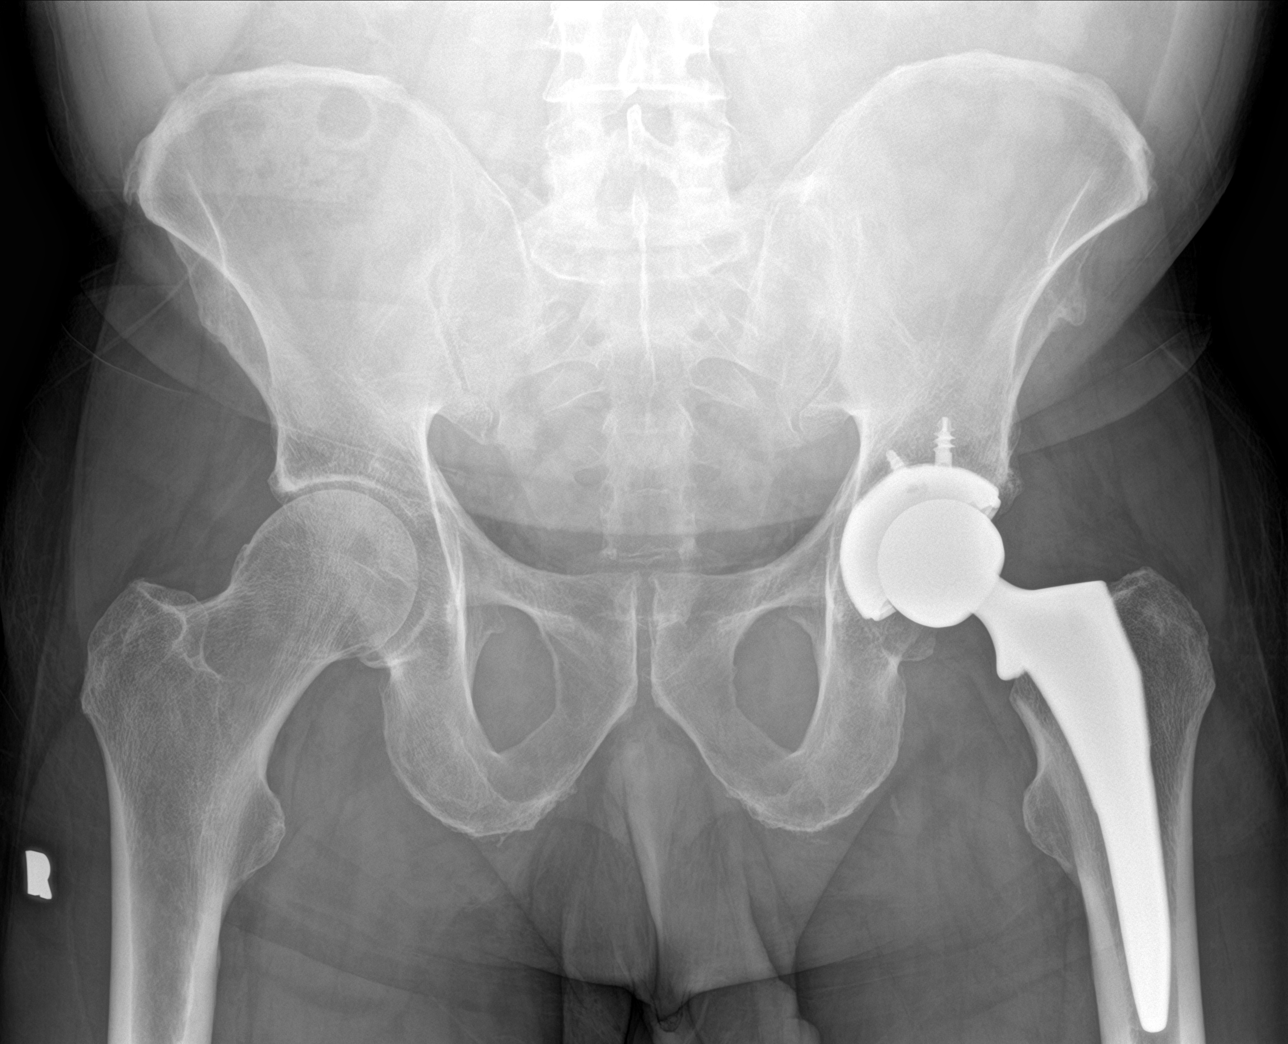

[abdomen supine (2 of 2)]
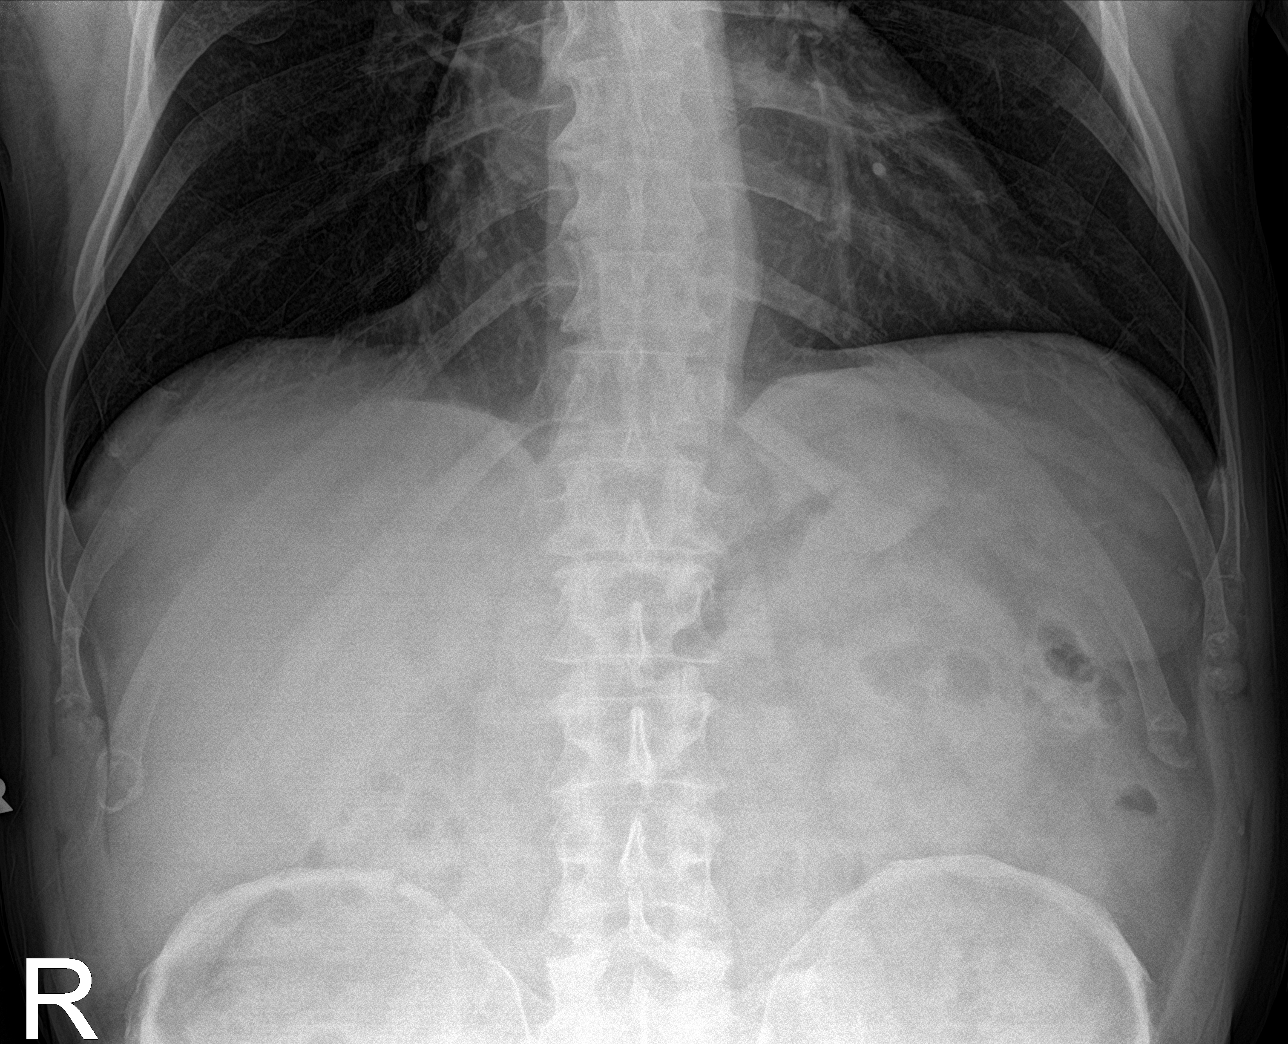

[3 of 3 positions shown; findings below may reference images not displayed]

FINDINGS: No dilated small bowel loops or air-fluid levels. Minimal colonic
stool. No evidence of pneumatosis or pneumoperitoneum. Clear lung
bases. No radiopaque nephrolithiasis. Left total hip arthroplasty.
Mild lumbar spondylosis.
IMPRESSION: Nonobstructive bowel gas pattern.  Minimal colonic stool.
